# Patient Record
Sex: Female | Born: 1950
Health system: Southern US, Community
[De-identification: ages and names within clinical notes are randomized; demographics above are authoritative.]

## PROBLEM LIST (undated history)

## (undated) DIAGNOSIS — O039 Complete or unspecified spontaneous abortion without complication: Secondary | ICD-10-CM

## (undated) DIAGNOSIS — R9431 Abnormal electrocardiogram [ECG] [EKG]: Secondary | ICD-10-CM

## (undated) DIAGNOSIS — I10 Essential (primary) hypertension: Secondary | ICD-10-CM

## (undated) DIAGNOSIS — E041 Nontoxic single thyroid nodule: Secondary | ICD-10-CM

## (undated) DIAGNOSIS — E118 Type 2 diabetes mellitus with unspecified complications: Secondary | ICD-10-CM

## (undated) DIAGNOSIS — T4145XA Adverse effect of unspecified anesthetic, initial encounter: Secondary | ICD-10-CM

## (undated) DIAGNOSIS — K449 Diaphragmatic hernia without obstruction or gangrene: Secondary | ICD-10-CM

## (undated) DIAGNOSIS — T7840XA Allergy, unspecified, initial encounter: Secondary | ICD-10-CM

## (undated) DIAGNOSIS — K219 Gastro-esophageal reflux disease without esophagitis: Secondary | ICD-10-CM

## (undated) DIAGNOSIS — M1990 Unspecified osteoarthritis, unspecified site: Secondary | ICD-10-CM

## (undated) DIAGNOSIS — K5790 Diverticulosis of intestine, part unspecified, without perforation or abscess without bleeding: Secondary | ICD-10-CM

## (undated) DIAGNOSIS — E785 Hyperlipidemia, unspecified: Secondary | ICD-10-CM

## (undated) DIAGNOSIS — H269 Unspecified cataract: Secondary | ICD-10-CM

## (undated) DIAGNOSIS — T300 Burn of unspecified body region, unspecified degree: Secondary | ICD-10-CM

## (undated) DIAGNOSIS — J309 Allergic rhinitis, unspecified: Secondary | ICD-10-CM

## (undated) DIAGNOSIS — E119 Type 2 diabetes mellitus without complications: Secondary | ICD-10-CM

## (undated) DIAGNOSIS — E282 Polycystic ovarian syndrome: Secondary | ICD-10-CM

## (undated) DIAGNOSIS — M6530 Trigger finger, unspecified finger: Secondary | ICD-10-CM

## (undated) DIAGNOSIS — K5792 Diverticulitis of intestine, part unspecified, without perforation or abscess without bleeding: Secondary | ICD-10-CM

## (undated) DIAGNOSIS — N83209 Unspecified ovarian cyst, unspecified side: Secondary | ICD-10-CM

## (undated) DIAGNOSIS — S62102A Fracture of unspecified carpal bone, left wrist, initial encounter for closed fracture: Secondary | ICD-10-CM

## (undated) DIAGNOSIS — Z8719 Personal history of other diseases of the digestive system: Secondary | ICD-10-CM

## (undated) DIAGNOSIS — T8859XA Other complications of anesthesia, initial encounter: Secondary | ICD-10-CM

## (undated) DIAGNOSIS — S63056A Dislocation of other carpometacarpal joint of unspecified hand, initial encounter: Secondary | ICD-10-CM

## (undated) DIAGNOSIS — J301 Allergic rhinitis due to pollen: Secondary | ICD-10-CM

## (undated) HISTORY — DX: Burn of unspecified body region, unspecified degree: T30.0

## (undated) HISTORY — DX: Type 2 diabetes mellitus without complications: E11.9

## (undated) HISTORY — DX: Allergy, unspecified, initial encounter: T78.40XA

## (undated) HISTORY — DX: Nontoxic single thyroid nodule: E04.1

## (undated) HISTORY — DX: Polycystic ovarian syndrome: E28.2

## (undated) HISTORY — DX: Gastro-esophageal reflux disease without esophagitis: K21.9

## (undated) HISTORY — DX: Allergic rhinitis, unspecified: J30.9

## (undated) HISTORY — DX: Diverticulosis of intestine, part unspecified, without perforation or abscess without bleeding: K57.90

## (undated) HISTORY — DX: Dislocation of other carpometacarpal joint of unspecified hand, initial encounter: S63.056A

## (undated) HISTORY — PX: TONSILECTOMY, ADENOIDECTOMY, BILATERAL MYRINGOTOMY AND TUBES: SHX2538

## (undated) HISTORY — DX: Unspecified osteoarthritis, unspecified site: M19.90

## (undated) HISTORY — DX: Unspecified cataract: H26.9

## (undated) HISTORY — DX: Unspecified ovarian cyst, unspecified side: N83.209

## (undated) HISTORY — DX: Essential (primary) hypertension: I10

## (undated) HISTORY — DX: Complete or unspecified spontaneous abortion without complication: O03.9

## (undated) HISTORY — DX: Gastro-esophageal reflux disease without esophagitis: K44.9

## (undated) HISTORY — DX: Hyperlipidemia, unspecified: E78.5

## (undated) HISTORY — PX: BREAST EXCISIONAL BIOPSY: SUR124

## (undated) HISTORY — DX: Diverticulitis of intestine, part unspecified, without perforation or abscess without bleeding: K57.92

## (undated) HISTORY — DX: Fracture of unspecified carpal bone, left wrist, initial encounter for closed fracture: S62.102A

## (undated) HISTORY — DX: Abnormal electrocardiogram (ECG) (EKG): R94.31

## (undated) HISTORY — DX: Trigger finger, unspecified finger: M65.30

---
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
## 1972-06-03 DIAGNOSIS — T300 Burn of unspecified body region, unspecified degree: Secondary | ICD-10-CM

## 1972-06-03 DIAGNOSIS — J309 Allergic rhinitis, unspecified: Secondary | ICD-10-CM

## 1972-06-03 HISTORY — DX: Allergic rhinitis, unspecified: J30.9

## 1972-06-03 HISTORY — DX: Burn of unspecified body region, unspecified degree: T30.0

## 1991-06-04 DIAGNOSIS — R9431 Abnormal electrocardiogram [ECG] [EKG]: Secondary | ICD-10-CM

## 1991-06-04 HISTORY — DX: Abnormal electrocardiogram (ECG) (EKG): R94.31

## 2003-06-04 HISTORY — PX: ESOPHAGOGASTRODUODENOSCOPY: SHX1529

## 2003-06-04 HISTORY — PX: EXCISION / BIOPSY BREAST / NIPPLE / DUCT: SUR469

## 2004-06-03 HISTORY — PX: LARYNGOSCOPY: SUR817

## 2005-06-03 DIAGNOSIS — N83209 Unspecified ovarian cyst, unspecified side: Secondary | ICD-10-CM

## 2005-06-03 HISTORY — DX: Unspecified ovarian cyst, unspecified side: N83.209

## 2005-06-03 HISTORY — PX: OTHER SURGICAL HISTORY: SHX169

## 2005-06-03 HISTORY — PX: BREAST SURGERY: SHX581

## 2008-03-07 LAB — HM DEXA SCAN: HM DEXA SCAN: NORMAL

## 2009-06-03 DIAGNOSIS — E041 Nontoxic single thyroid nodule: Secondary | ICD-10-CM

## 2009-06-03 HISTORY — DX: Nontoxic single thyroid nodule: E04.1

## 2010-06-03 DIAGNOSIS — S63056A Dislocation of other carpometacarpal joint of unspecified hand, initial encounter: Secondary | ICD-10-CM

## 2010-06-03 DIAGNOSIS — M6530 Trigger finger, unspecified finger: Secondary | ICD-10-CM

## 2010-06-03 HISTORY — DX: Trigger finger, unspecified finger: M65.30

## 2010-06-03 HISTORY — DX: Dislocation of other carpometacarpal joint of unspecified hand, initial encounter: S63.056A

## 2013-08-24 HISTORY — PX: COLONOSCOPY: SHX5424

## 2016-08-31 ENCOUNTER — Encounter: Payer: Self-pay | Admitting: Internal Medicine

## 2016-09-06 ENCOUNTER — Encounter: Payer: Self-pay | Admitting: Internal Medicine

## 2016-09-06 ENCOUNTER — Ambulatory Visit
Admission: RE | Admit: 2016-09-06 | Discharge: 2016-09-06 | Disposition: A | Discharge status: Home / Self Care | Payer: Medicare Other | Source: Ambulatory Visit | Attending: Internal Medicine | Admitting: Internal Medicine

## 2016-09-06 ENCOUNTER — Ambulatory Visit (INDEPENDENT_AMBULATORY_CARE_PROVIDER_SITE_OTHER): Payer: Medicare Other | Admitting: Internal Medicine

## 2016-09-06 ENCOUNTER — Other Ambulatory Visit: Payer: Self-pay | Admitting: Internal Medicine

## 2016-09-06 VITALS — BP 124/76 | HR 81 | Temp 98.4°F | Ht 63.39 in | Wt 179.0 lb

## 2016-09-06 DIAGNOSIS — E119 Type 2 diabetes mellitus without complications: Secondary | ICD-10-CM

## 2016-09-06 DIAGNOSIS — D242 Benign neoplasm of left breast: Secondary | ICD-10-CM | POA: Diagnosis not present

## 2016-09-06 DIAGNOSIS — E785 Hyperlipidemia, unspecified: Secondary | ICD-10-CM

## 2016-09-06 DIAGNOSIS — E041 Nontoxic single thyroid nodule: Secondary | ICD-10-CM | POA: Diagnosis not present

## 2016-09-06 DIAGNOSIS — K449 Diaphragmatic hernia without obstruction or gangrene: Secondary | ICD-10-CM | POA: Diagnosis not present

## 2016-09-06 DIAGNOSIS — J40 Bronchitis, not specified as acute or chronic: Secondary | ICD-10-CM | POA: Insufficient documentation

## 2016-09-06 DIAGNOSIS — K219 Gastro-esophageal reflux disease without esophagitis: Secondary | ICD-10-CM | POA: Insufficient documentation

## 2016-09-06 DIAGNOSIS — E118 Type 2 diabetes mellitus with unspecified complications: Secondary | ICD-10-CM | POA: Insufficient documentation

## 2016-09-06 DIAGNOSIS — N6011 Diffuse cystic mastopathy of right breast: Secondary | ICD-10-CM | POA: Insufficient documentation

## 2016-09-06 DIAGNOSIS — E782 Mixed hyperlipidemia: Secondary | ICD-10-CM | POA: Diagnosis not present

## 2016-09-06 DIAGNOSIS — D126 Benign neoplasm of colon, unspecified: Secondary | ICD-10-CM | POA: Diagnosis not present

## 2016-09-06 DIAGNOSIS — R05 Cough: Secondary | ICD-10-CM | POA: Diagnosis not present

## 2016-09-06 DIAGNOSIS — I10 Essential (primary) hypertension: Secondary | ICD-10-CM | POA: Diagnosis not present

## 2016-09-06 DIAGNOSIS — N6012 Diffuse cystic mastopathy of left breast: Secondary | ICD-10-CM

## 2016-09-06 DIAGNOSIS — E1169 Type 2 diabetes mellitus with other specified complication: Secondary | ICD-10-CM | POA: Insufficient documentation

## 2016-09-06 MED ORDER — ATORVASTATIN CALCIUM 20 MG PO TABS: 20.0000 mg | ORAL_TABLET | Freq: Every day | ORAL | 1 refills | Status: DC

## 2016-09-06 MED ORDER — LEVOFLOXACIN 500 MG PO TABS: 500.0000 mg | ORAL_TABLET | Freq: Every day | ORAL | 0 refills | Status: DC

## 2016-09-06 MED ORDER — LISINOPRIL 10 MG PO TABS: 10.0000 mg | ORAL_TABLET | Freq: Every day | ORAL | 1 refills | Status: DC

## 2016-09-06 MED ORDER — METFORMIN HCL 500 MG PO TABS: 250.0000 mg | ORAL_TABLET | Freq: Every day | ORAL | 1 refills | Status: DC

## 2016-09-06 NOTE — Progress Notes (Signed)
Date:  09/06/2016   Name:  Pamela Rosario   DOB:  [birth year]   MRN:  [id number]   Chief Complaint: Establish Care (Son had baby and moved here from [facility] to help care for him.) and Cough (Off on and cough since November. Thinks it post nasal drip- Green production in morning but that is a new symptom. Tickle in throat and sometimes wheezes. ) Cough  This is a new problem. The current episode started more than 1 month ago. The problem has been unchanged. The problem occurs every few hours. The cough is productive of sputum. Associated symptoms include nasal congestion, postnasal drip, a sore throat and wheezing. Pertinent negatives include no chest pain, chills, ear pain, fever, headaches or shortness of breath. She has tried nothing for the symptoms.  Diabetes  She presents for her follow-up (onset 2007) diabetic visit. She has type 2 diabetes mellitus. Pertinent negatives for hypoglycemia include no dizziness, headaches or pallor. Pertinent negatives for diabetes include no chest pain. Current diabetic treatment includes oral agent (monotherapy).  Hyperlipidemia  This is a chronic problem. The problem is controlled. Pertinent negatives include no chest pain or shortness of breath. Current antihyperlipidemic treatment includes statins.  Hypertension  This is a new problem. The current episode started more than 1 year ago. The problem is unchanged. The problem is controlled. Pertinent negatives include no chest pain, headaches, palpitations or shortness of breath.  Gastroesophageal Reflux  She complains of coughing, a sore throat and wheezing. She reports no abdominal pain or no chest pain.    Review of Systems  Constitutional: Negative for chills, diaphoresis and fever.  HENT: Positive for postnasal drip and sore throat. Negative for ear pain, trouble swallowing and voice change.   Eyes: Negative for visual disturbance.  Respiratory: Positive for cough and wheezing. Negative for chest tightness  and shortness of breath.   Cardiovascular: Negative for chest pain, palpitations and leg swelling.  Gastrointestinal: Negative for abdominal pain.  Musculoskeletal: Negative for arthralgias and gait problem.  Skin: Negative for pallor and wound.  Neurological: Negative for dizziness, numbness and headaches.  Psychiatric/Behavioral: Negative for sleep disturbance.    There are no active problems to display for this patient.   Prior to Admission medications   Medication Sig Start Date End Date Taking? Authorizing Provider  aspirin 81 MG chewable tablet Chew 81 mg by mouth daily.   Yes Historical Provider, MD  atorvastatin (LIPITOR) 40 MG tablet Take 40 mg by mouth daily. 06/13/16  Yes Historical Provider, MD  glucose blood test strip 1 each by Other route as needed for other. Use as instructed   Yes Historical Provider, MD  lisinopril (PRINIVIL,ZESTRIL) 20 MG tablet Take 10 mg by mouth daily. 06/13/16  Yes Historical Provider, MD  metFORMIN (GLUCOPHAGE) 500 MG tablet Take 250 mg by mouth daily. 06/13/16  Yes Historical Provider, MD  omeprazole (PRILOSEC) 20 MG capsule Take 20 mg by mouth daily.   Yes Historical Provider, MD    Allergies  Allergen Reactions  . Demerol [Meperidine] Hives  . Tetracyclines & Related Hives  . Pravastatin Other (See Comments)    Muscle pain  . Simvastatin Other (See Comments)    Muscle pain    Past Surgical History:  Procedure Laterality Date  . TONSILECTOMY, ADENOIDECTOMY, BILATERAL MYRINGOTOMY AND TUBES      Social History  Substance Use Topics  . Smoking status: Never Smoker  . Smokeless tobacco: Never Used  . Alcohol use No  Medication list has been reviewed and updated.   Physical Exam  Constitutional: She is oriented to person, place, and time. She appears well-developed. No distress.  HENT:  Head: Normocephalic and atraumatic.  Neck: Normal range of motion. Neck supple. No thyromegaly present.  Cardiovascular: Normal rate, regular  rhythm, normal heart sounds and normal pulses.   Pulmonary/Chest: Effort normal. No respiratory distress. She has no decreased breath sounds. She has no wheezes. She has rhonchi in the left lower field.  Abdominal: Soft.  Musculoskeletal: Normal range of motion.  Neurological: She is alert and oriented to person, place, and time.  Skin: Skin is warm and dry. No rash noted.  Psychiatric: She has a normal mood and affect. Her speech is normal and behavior is normal. Thought content normal.  Nursing note and vitals reviewed.   BP 124/76 (BP Location: Left Arm, Patient Position: Sitting, Cuff Size: Normal)   Pulse 81   Temp 98.4 F (36.9 C)   Ht 5' 3.39" (1.61 m)   Wt 179 lb (81.2 kg)   SpO2 96%   BMI 31.32 kg/m   Assessment and Plan: 1. Bronchitis - levofloxacin (LEVAQUIN) 500 MG tablet; Take 1 tablet (500 mg total) by mouth daily.  Dispense: 10 tablet; Refill: 0 - DG Chest 2 View; Future  2. Type 2 diabetes mellitus without complication, without long-term current use of insulin (HCC) Continue oral agents  3. Essential hypertension controlled  4. Mixed hyperlipidemia On statin therapy  5. Intraductal papilloma of breast, left s/p excisional bx > 10 yrs Due for 6 mo Dx mammo and [place] on left for new density seen 04/2016  6. Fibrocystic breast changes of both breasts As above  7. Hiatal hernia with GERD without esophagitis Controlled on PPI  8. Thyroid nodule Stable nodules noted in 2017   9. Adenomatous polyp of colon, unspecified part of colon Due for colonoscopy 2020   Meds ordered this encounter  Medications  . atorvastatin (LIPITOR) 20 MG tablet    Sig: Take 1 tablet (20 mg total) by mouth daily.    Dispense:  90 tablet    Refill:  1  . lisinopril (PRINIVIL,ZESTRIL) 10 MG tablet    Sig: Take 1 tablet (10 mg total) by mouth daily.    Dispense:  90 tablet    Refill:  1  . metFORMIN (GLUCOPHAGE) 500 MG tablet    Sig: Take 0.5 tablets (250 mg total) by mouth  daily.    Dispense:  45 tablet    Refill:  1  . levofloxacin (LEVAQUIN) 500 MG tablet    Sig: Take 1 tablet (500 mg total) by mouth daily.    Dispense:  10 tablet    Refill:  0    Halina Maidens, MD [facility]  09/06/2016

## 2016-09-17 ENCOUNTER — Inpatient Hospital Stay
Admission: RE | Admit: 2016-09-17 | Discharge: 2016-09-17 | Disposition: A | Discharge status: Home / Self Care | Payer: Self-pay | Source: Ambulatory Visit | Attending: *Deleted | Admitting: *Deleted

## 2016-09-17 ENCOUNTER — Other Ambulatory Visit: Payer: Self-pay | Admitting: *Deleted

## 2016-09-17 DIAGNOSIS — Z9289 Personal history of other medical treatment: Secondary | ICD-10-CM

## 2016-09-20 ENCOUNTER — Other Ambulatory Visit: Payer: Self-pay | Admitting: Internal Medicine

## 2016-09-20 ENCOUNTER — Telehealth: Payer: Self-pay

## 2016-09-20 DIAGNOSIS — R928 Other abnormal and inconclusive findings on diagnostic imaging of breast: Secondary | ICD-10-CM

## 2016-09-20 DIAGNOSIS — Z1231 Encounter for screening mammogram for malignant neoplasm of breast: Secondary | ICD-10-CM

## 2016-09-20 NOTE — Telephone Encounter (Signed)
Pt informed order sent.

## 2016-09-20 NOTE — Telephone Encounter (Signed)
Pt called requesting for order to be sent in for mammo.

## 2016-09-20 NOTE — Telephone Encounter (Signed)
Orders placed.

## 2016-09-23 ENCOUNTER — Other Ambulatory Visit: Payer: Self-pay | Admitting: Internal Medicine

## 2016-09-23 DIAGNOSIS — R928 Other abnormal and inconclusive findings on diagnostic imaging of breast: Secondary | ICD-10-CM

## 2016-10-11 ENCOUNTER — Ambulatory Visit
Admission: RE | Admit: 2016-10-11 | Discharge: 2016-10-11 | Disposition: A | Discharge status: Home / Self Care | Payer: Medicare Other | Source: Ambulatory Visit | Attending: Internal Medicine | Admitting: Internal Medicine

## 2016-10-11 ENCOUNTER — Encounter: Payer: Self-pay | Admitting: Radiology

## 2016-10-11 DIAGNOSIS — R928 Other abnormal and inconclusive findings on diagnostic imaging of breast: Secondary | ICD-10-CM | POA: Diagnosis not present

## 2016-10-11 DIAGNOSIS — N6002 Solitary cyst of left breast: Secondary | ICD-10-CM | POA: Diagnosis not present

## 2016-10-18 DIAGNOSIS — J0100 Acute maxillary sinusitis, unspecified: Secondary | ICD-10-CM | POA: Diagnosis not present

## 2016-10-18 DIAGNOSIS — R05 Cough: Secondary | ICD-10-CM | POA: Diagnosis not present

## 2016-11-13 ENCOUNTER — Ambulatory Visit (INDEPENDENT_AMBULATORY_CARE_PROVIDER_SITE_OTHER): Payer: Medicare Other | Admitting: Internal Medicine

## 2016-11-13 ENCOUNTER — Encounter: Payer: Self-pay | Admitting: Internal Medicine

## 2016-11-13 VITALS — BP 120/64 | HR 81 | Temp 98.2°F | Resp 17 | Ht 63.39 in | Wt 179.0 lb

## 2016-11-13 DIAGNOSIS — E041 Nontoxic single thyroid nodule: Secondary | ICD-10-CM | POA: Diagnosis not present

## 2016-11-13 DIAGNOSIS — N6012 Diffuse cystic mastopathy of left breast: Secondary | ICD-10-CM

## 2016-11-13 DIAGNOSIS — N6011 Diffuse cystic mastopathy of right breast: Secondary | ICD-10-CM

## 2016-11-13 DIAGNOSIS — E119 Type 2 diabetes mellitus without complications: Secondary | ICD-10-CM

## 2016-11-13 DIAGNOSIS — I10 Essential (primary) hypertension: Secondary | ICD-10-CM

## 2016-11-13 DIAGNOSIS — E782 Mixed hyperlipidemia: Secondary | ICD-10-CM | POA: Diagnosis not present

## 2016-11-13 NOTE — Patient Instructions (Addendum)
Schedule Diabetic eye Exam

## 2016-11-13 NOTE — Progress Notes (Signed)
Date:  11/13/2016   Name:  Pamela Rosario   DOB:  1951/06/03   MRN:  [id number]   Chief Complaint: Diabetes and Hypertension Diabetes  She presents for her follow-up diabetic visit. She has type 2 diabetes mellitus. Pertinent negatives for hypoglycemia include no headaches or tremors. Pertinent negatives for diabetes include no chest pain, no fatigue, no polydipsia and no polyuria. Current diabetic treatment includes oral agent (monotherapy). She is compliant with treatment most of the time. She monitors blood glucose at home 3-4 x per week. Her breakfast blood glucose is taken between 7-8 am. Her breakfast blood glucose range is generally 110-130 mg/dl. An ACE inhibitor/angiotensin II receptor blocker is being taken.  Hypertension  This is a chronic problem. The problem is controlled. Pertinent negatives include no chest pain, headaches, palpitations or shortness of breath. Past treatments include ACE inhibitors. The current treatment provides significant improvement.  Abnormal mammogram - had abnormal mammogram in 04/2016.  Recent 6 mo follow up showed stable findings and recommend annual screening.  Recurrent bronchitis - resolved after augmentin given at [facility].    Review of Systems  Constitutional: Negative for appetite change, fatigue, fever and unexpected weight change.  HENT: Negative for tinnitus and trouble swallowing.   Eyes: Negative for visual disturbance.  Respiratory: Negative for cough, chest tightness and shortness of breath.   Cardiovascular: Negative for chest pain, palpitations and leg swelling.  Gastrointestinal: Negative for abdominal pain.  Endocrine: Negative for polydipsia and polyuria.  Genitourinary: Negative for dysuria and hematuria.  Musculoskeletal: Negative for arthralgias.  Neurological: Negative for tremors, numbness and headaches.  Psychiatric/Behavioral: Negative for dysphoric mood.    Patient Active Problem List   Diagnosis Date Noted  . Hiatal  hernia with GERD without esophagitis 09/06/2016  . Fibrocystic breast changes of both breasts 09/06/2016  . Intraductal papilloma of breast, left 09/06/2016  . Mixed hyperlipidemia 09/06/2016  . Essential hypertension 09/06/2016  . Type 2 diabetes mellitus without complication, without long-term current use of insulin ([place]) 09/06/2016  . Thyroid nodule 09/06/2016  . Adenomatous colon polyp 09/06/2016    Prior to Admission medications   Medication Sig Start Date End Date Taking? Authorizing Provider  aspirin 81 MG chewable tablet Chew 81 mg by mouth daily.   Yes [provider]  atorvastatin (LIPITOR) 20 MG tablet Take 1 tablet (20 mg total) by mouth daily. 09/06/16  Yes Glean Hess, MD  glucose blood test strip 1 each by Other route as needed for other. Use as instructed   Yes [provider]  lisinopril (PRINIVIL,ZESTRIL) 10 MG tablet Take 1 tablet (10 mg total) by mouth daily. 09/06/16  Yes Glean Hess, MD  metFORMIN (GLUCOPHAGE) 500 MG tablet Take 0.5 tablets (250 mg total) by mouth daily. 09/06/16  Yes Glean Hess, MD  omeprazole (PRILOSEC) 20 MG capsule Take 20 mg by mouth daily.   Yes [provider]    Allergies  Allergen Reactions  . Demerol [Meperidine] Hives  . Tetracyclines & Related Hives  . Simvastatin Other (See Comments)    Muscle pain    Past Surgical History:  Procedure Laterality Date  . brachial cleft cyst excison ( Right Neck )  1971  . [place]   emergency  . COLONOSCOPY  08/24/2013   tubular adenoma  . ESOPHAGOGASTRODUODENOSCOPY  2005   hiatal hernia & polyp found  . EXCISION / BIOPSY BREAST / NIPPLE / DUCT Left 2005  . LARYNGOSCOPY  2006  .  left intraductal papilloma excision of left breast  2007  . TONSILECTOMY, ADENOIDECTOMY, BILATERAL MYRINGOTOMY AND TUBES      Social History  Substance Use Topics  . Smoking status: Never Smoker  . Smokeless tobacco: Never Used  . Alcohol use No    Depression screen PHQ 2/9 11/13/2016  Decreased Interest 0  Down, Depressed, Hopeless 0  PHQ - 2 Score 0   Fall Risk  11/13/2016  Falls in the past year? No    Medication list has been reviewed and updated.   Physical Exam  Constitutional: She is oriented to person, place, and time. She appears well-developed. No distress.  HENT:  Head: Normocephalic and atraumatic.  Neck: Normal range of motion. Neck supple. Carotid bruit is not present. No thyromegaly present.  Cardiovascular: Normal rate, regular rhythm and normal heart sounds.   Pulmonary/Chest: Effort normal and breath sounds normal. No respiratory distress. She has no wheezes. She has no rales.  Musculoskeletal: Normal range of motion.  Neurological: She is alert and oriented to person, place, and time.  Skin: Skin is warm and dry. No rash noted.  Psychiatric: She has a normal mood and affect. Her behavior is normal. Thought content normal.  Nursing note and vitals reviewed.   BP 120/64 (BP Location: Right Arm, Patient Position: Sitting, Cuff Size: Normal)   Pulse 81   Temp 98.2 F (36.8 C) (Oral)   Resp 17   Ht 5' 3.39" (1.61 m)   Wt 179 lb (81.2 kg)   SpO2 97%   BMI 31.32 kg/m   Assessment and Plan: 1. Type 2 diabetes mellitus without complication, without long-term current use of insulin (HCC) Continue diet and oral medication - Hemoglobin H4T - Basic metabolic panel  2. Essential hypertension Controlled   3. Mixed hyperlipidemia On statin therapy  4. Thyroid nodule asx - repeat US in November  5. Fibrocystic breast changes of both breasts Recent mammogram stable Resume annual exams   No orders of the defined types were placed in this encounter.   Halina Maidens, MD [facility]  11/13/2016

## 2016-11-14 LAB — BASIC METABOLIC PANEL
BUN/Creatinine Ratio: 20 (ref 12–28)
BUN: 18 mg/dL (ref 8–27)
CO2: 24 mmol/L (ref 20–29)
Calcium: 9.4 mg/dL (ref 8.7–10.3)
Chloride: 100 mmol/L (ref 96–106)
Creatinine, Ser: 0.91 mg/dL (ref 0.57–1.00)
GFR calc Af Amer: 77 mL/min/{1.73_m2} (ref >59)
GFR calc non Af Amer: 66 mL/min/{1.73_m2} (ref >59)
GLUCOSE: 103 mg/dL — AB (ref 65–99)
POTASSIUM: 4.4 mmol/L (ref 3.5–5.2)
SODIUM: 141 mmol/L (ref 134–144)

## 2016-11-14 LAB — HEMOGLOBIN A1C
Est. average glucose Bld gHb Est-mCnc: 146 mg/dL
Hgb A1c MFr Bld: 6.7 % — ABNORMAL HIGH (ref 4.8–5.6)

## 2016-11-26 DIAGNOSIS — R05 Cough: Secondary | ICD-10-CM | POA: Diagnosis not present

## 2016-11-26 DIAGNOSIS — J301 Allergic rhinitis due to pollen: Secondary | ICD-10-CM | POA: Diagnosis not present

## 2016-11-26 DIAGNOSIS — J4520 Mild intermittent asthma, uncomplicated: Secondary | ICD-10-CM | POA: Diagnosis not present

## 2016-12-17 DIAGNOSIS — J301 Allergic rhinitis due to pollen: Secondary | ICD-10-CM | POA: Diagnosis not present

## 2016-12-17 DIAGNOSIS — Z91013 Allergy to seafood: Secondary | ICD-10-CM | POA: Diagnosis not present

## 2017-01-16 DIAGNOSIS — J301 Allergic rhinitis due to pollen: Secondary | ICD-10-CM | POA: Diagnosis not present

## 2017-01-20 DIAGNOSIS — Z516 Encounter for desensitization to allergens: Secondary | ICD-10-CM | POA: Insufficient documentation

## 2017-01-20 DIAGNOSIS — J301 Allergic rhinitis due to pollen: Secondary | ICD-10-CM | POA: Diagnosis not present

## 2017-01-24 DIAGNOSIS — J301 Allergic rhinitis due to pollen: Secondary | ICD-10-CM | POA: Diagnosis not present

## 2017-01-27 ENCOUNTER — Other Ambulatory Visit: Payer: Self-pay | Admitting: Internal Medicine

## 2017-01-27 ENCOUNTER — Ambulatory Visit (INDEPENDENT_AMBULATORY_CARE_PROVIDER_SITE_OTHER): Payer: Medicare Other

## 2017-01-27 VITALS — BP 132/70 | HR 78 | Temp 98.4°F | Resp 17 | Ht 63.0 in | Wt 178.6 lb

## 2017-01-27 DIAGNOSIS — Z0000 Encounter for general adult medical examination without abnormal findings: Secondary | ICD-10-CM

## 2017-01-27 MED ORDER — OMEPRAZOLE 20 MG PO TBEC: 20.0000 mg | DELAYED_RELEASE_TABLET | Freq: Two times a day (BID) | ORAL | 1 refills | Status: DC

## 2017-01-27 MED ORDER — LISINOPRIL 10 MG PO TABS: 10.0000 mg | ORAL_TABLET | Freq: Every day | ORAL | 1 refills | Status: DC

## 2017-01-27 NOTE — Patient Instructions (Addendum)
Pamela Rosario , Thank you for taking time to come for your Medicare Wellness Visit. I appreciate your ongoing commitment to your health goals. Please review the following plan we discussed and let me know if I can assist you in the future.   Screening recommendations/referrals: Colonoscopy: up to date Mammogram:up to date Bone Density: up to date Recommended yearly ophthalmology/optometry visit for glaucoma screening and checkup Recommended yearly dental visit for hygiene and checkup  Vaccinations: Influenza vaccine: due now Pneumococcal vaccine: up to date Tdap vaccine: up to date Shingles vaccine: due, check with your insurance company for coverage  Please bring a copy of any previous screenings and vaccinations.  Advanced directives: Please bring a copy of your health care power of attorney and living will to the office at your convenience.  Conditions/risks identified: none   Next appointment: Follow up on 02/25/2017 at 9:30am with Dr.Berglund. Follow up in one year for your annual wellness exam.    Preventive Care 65 Years and Older, Female Preventive care refers to lifestyle choices and visits with your health care provider that can promote health and wellness. What does preventive care include?  A yearly physical exam. This is also called an annual well check.  Dental exams once or twice a year.  Routine eye exams. Ask your health care provider how often you should have your eyes checked.  Personal lifestyle choices, including:  Daily care of your teeth and gums.  Regular physical activity.  Eating a healthy diet.  Avoiding tobacco and drug use.  Limiting alcohol use.  Practicing safe sex.  Taking low-dose aspirin every day.  Taking vitamin and mineral supplements as recommended by your health care provider. What happens during an annual well check? The services and screenings done by your health care provider during your annual well check will depend on your  age, overall health, lifestyle risk factors, and family history of disease. Counseling  Your health care provider may ask you questions about your:  Alcohol use.  Tobacco use.  Drug use.  Emotional well-being.  Home and relationship well-being.  Sexual activity.  Eating habits.  History of falls.  Memory and ability to understand (cognition).  Work and work Statistician.  Reproductive health. Screening  You may have the following tests or measurements:  Height, weight, and BMI.  Blood pressure.  Lipid and cholesterol levels. These may be checked every 5 years, or more frequently if you are over 58 years old.  Skin check.  Lung cancer screening. You may have this screening every year starting at age 41 if you have a 30-pack-year history of smoking and currently smoke or have quit within the past 15 years.  Fecal occult blood test (FOBT) of the stool. You may have this test every year starting at age 90.  Flexible sigmoidoscopy or colonoscopy. You may have a sigmoidoscopy every 5 years or a colonoscopy every 10 years starting at age 71.  Hepatitis C blood test.  Hepatitis B blood test.  Sexually transmitted disease (STD) testing.  Diabetes screening. This is done by checking your blood sugar (glucose) after you have not eaten for a while (fasting). You may have this done every 1-3 years.  Bone density scan. This is done to screen for osteoporosis. You may have this done starting at age 60.  Mammogram. This may be done every 1-2 years. Talk to your health care provider about how often you should have regular mammograms. Talk with your health care provider about your test results, treatment  options, and if necessary, the need for more tests. Vaccines  Your health care provider may recommend certain vaccines, such as:  Influenza vaccine. This is recommended every year.  Tetanus, diphtheria, and acellular pertussis (Tdap, Td) vaccine. You may need a Td booster  every 10 years.  Zoster vaccine. You may need this after age 90.  Pneumococcal 13-valent conjugate (PCV13) vaccine. One dose is recommended after age 12.  Pneumococcal polysaccharide (PPSV23) vaccine. One dose is recommended after age 50. Talk to your health care provider about which screenings and vaccines you need and how often you need them. This information is not intended to replace advice given to you by your health care provider. Make sure you discuss any questions you have with your health care provider. Document Released: 06/16/2015 Document Revised: 02/07/2016 Document Reviewed: 03/21/2015 Elsevier Interactive Patient Education  2017 [place] Prevention in the Home Falls can cause injuries. They can happen to people of all ages. There are many things you can do to make your home safe and to help prevent falls. What can I do on the outside of my home?  Regularly fix the edges of walkways and driveways and fix any cracks.  Remove anything that might make you trip as you walk through a door, such as a raised step or threshold.  Trim any bushes or trees on the path to your home.  Use bright outdoor lighting.  Clear any walking paths of anything that might make someone trip, such as rocks or tools.  Regularly check to see if handrails are loose or broken. Make sure that both sides of any steps have handrails.  Any raised decks and porches should have guardrails on the edges.  Have any leaves, snow, or ice cleared regularly.  Use sand or salt on walking paths during winter.  Clean up any spills in your garage right away. This includes oil or grease spills. What can I do in the bathroom?  Use night lights.  Install grab bars by the toilet and in the tub and shower. Do not use towel bars as grab bars.  Use non-skid mats or decals in the tub or shower.  If you need to sit down in the shower, use a plastic, non-slip stool.  Keep the floor dry. Clean up any  water that spills on the floor as soon as it happens.  Remove soap buildup in the tub or shower regularly.  Attach bath mats securely with double-sided non-slip rug tape.  Do not have throw rugs and other things on the floor that can make you trip. What can I do in the bedroom?  Use night lights.  Make sure that you have a light by your bed that is easy to reach.  Do not use any sheets or blankets that are too big for your bed. They should not hang down onto the floor.  Have a firm chair that has side arms. You can use this for support while you get dressed.  Do not have throw rugs and other things on the floor that can make you trip. What can I do in the kitchen?  Clean up any spills right away.  Avoid walking on wet floors.  Keep items that you use a lot in easy-to-reach places.  If you need to reach something above you, use a strong step stool that has a grab bar.  Keep electrical cords out of the way.  Do not use floor polish or wax that makes floors slippery.  If you must use wax, use non-skid floor wax.  Do not have throw rugs and other things on the floor that can make you trip. What can I do with my stairs?  Do not leave any items on the stairs.  Make sure that there are handrails on both sides of the stairs and use them. Fix handrails that are broken or loose. Make sure that handrails are as long as the stairways.  Check any carpeting to make sure that it is firmly attached to the stairs. Fix any carpet that is loose or worn.  Avoid having throw rugs at the top or bottom of the stairs. If you do have throw rugs, attach them to the floor with carpet tape.  Make sure that you have a light switch at the top of the stairs and the bottom of the stairs. If you do not have them, ask someone to add them for you. What else can I do to help prevent falls?  Wear shoes that:  Do not have high heels.  Have rubber bottoms.  Are comfortable and fit you well.  Are closed  at the toe. Do not wear sandals.  If you use a stepladder:  Make sure that it is fully opened. Do not climb a closed stepladder.  Make sure that both sides of the stepladder are locked into place.  Ask someone to hold it for you, if possible.  Clearly mark and make sure that you can see:  Any grab bars or handrails.  First and last steps.  Where the edge of each step is.  Use tools that help you move around (mobility aids) if they are needed. These include:  Canes.  Walkers.  Scooters.  Crutches.  Turn on the lights when you go into a dark area. Replace any light bulbs as soon as they burn out.  Set up your furniture so you have a clear path. Avoid moving your furniture around.  If any of your floors are uneven, fix them.  If there are any pets around you, be aware of where they are.  Review your medicines with your doctor. Some medicines can make you feel dizzy. This can increase your chance of falling. Ask your doctor what other things that you can do to help prevent falls. This information is not intended to replace advice given to you by your health care provider. Make sure you discuss any questions you have with your health care provider. Document Released: 03/16/2009 Document Revised: 10/26/2015 Document Reviewed: 06/24/2014 Elsevier Interactive Patient Education  2017 [employer].   As discussed, here is the information about the newest shingles vaccine:   Varicella-Zoster Virus Vaccine Live injection What is this medicine? VARICELLA VIRUS VACCINE (var uh SEL uh VAHY ruhs vak SEEN) is used to prevent infections of chickenpox. HERPES ZOSTER VIRUS VACCINE (HUR peez ZOS ter vahy ruhs vak SEEN) is used to prevent shingles in adults 65 years old and over. This vaccine is not used to treat shingles or nerve pain from shingles. These medicines may be used for other purposes; ask your health care provider or pharmacist if you have questions. This medicine may be  used for other purposes; ask your health care provider or pharmacist if you have questions. COMMON BRAND NAME(S): Varivax, Zostavax What should I tell my health care provider before I take this medicine? They need to know if you have any of the following conditions: -blood disorders or disease -cancer like leukemia or lymphoma -immune system problems or  therapy -infection with fever -recent immune globulin therapy -tuberculosis -an unusual or allergic reaction to vaccines, neomycin, gelatin, other medicines, foods, dyes, or preservatives -pregnant or trying to get pregnant -breast-feeding How should I use this medicine? These vaccines are for injection under the skin. They are given by a health care professional. A copy of Vaccine Information Statements will be given before each varicella virus vaccination. Read this sheet carefully each time. The sheet may change frequently. A Vaccine Information Statement is not given before the herpes zoster virus vaccine. Talk to your pediatrician regarding the use of the varicella virus vaccine in children. While this drug may be prescribed for children as young as 2 months of age for selected conditions, precautions do apply. The herpes zoster virus vaccine is not approved in children. Overdosage: If you think you have taken too much of this medicine contact a poison control center or emergency room at once. NOTE: This medicine is only for you. Do not share this medicine with others. What if I miss a dose? Keep appointments for follow-up (booster) doses of varicella virus vaccine as directed. It is important not to miss your dose. Call your doctor or health care professional if you are unable to keep an appointment. Follow-up (booster) doses are not needed for the herpes zoster virus vaccine. What may interact with this medicine? Do not take these medicines with any of the following  medications: -adalimumab -anakinra -etanercept -infliximab -medicines that suppress your immune system -medicines to treat cancer These medicines may also interact with the following medications: -aspirin and aspirin-like medicines (varicella virus vaccine only) -blood transfusions (varicella virus vaccine only) -immunoglobulins (varicella virus vaccine only) -steroid medicines like prednisone or cortisone This list may not describe all possible interactions. Give your health care provider a list of all the medicines, herbs, non-prescription drugs, or dietary supplements you use. Also tell them if you smoke, drink alcohol, or use illegal drugs. Some items may interact with your medicine. What should I watch for while using this medicine? Visit your doctor for regular check ups. These vaccines, like all vaccines, may not fully protect everyone. After receiving these vaccines it may be possible to pass chickenpox infection to others. For up to 6 weeks, avoid people with immune system problems, pregnant women who have not had chickenpox, newborns of women who have not had chickenpox, and all newborns born at less than 28 weeks of pregnancy. Talk to your doctor for more information. Do not become pregnant for 3 months after taking these vaccines. Women should inform their doctor if they wish to become pregnant or think they might be pregnant. There is a potential for serious side effects to an unborn child. Talk to your health care professional or pharmacist for more information. What side effects may I notice from receiving this medicine? Side effects that you should report to your doctor or health care professional as soon as possible: -allergic reactions like skin rash, itching or hives, swelling of the face, lips, or tongue -breathing problems -extreme changes in behavior -feeling faint or lightheaded, falls -fever over 102 degrees F -pain, tingling, numbness in the hands or feet -redness,  blistering, peeling or loosening of the skin, including inside the mouth -seizures -unusually weak or tired Side effects that usually do not require medical attention (report to your doctor or health care professional if they continue or are bothersome): -aches or pains -chickenpox-like rash -diarrhea -headache -low-grade fever under 102 degrees F -loss of appetite -nausea, vomiting -redness, pain, swelling  at site where injected -sleepy -trouble sleeping This list may not describe all possible side effects. Call your doctor for medical advice about side effects. You may report side effects to FDA at 1-800-FDA-1088. Where should I keep my medicine? These drugs are given in a hospital or clinic and will not be stored at home. NOTE: This sheet is a summary. It may not cover all possible information. If you have questions about this medicine, talk to your doctor, pharmacist, or health care provider.  2018 Elsevier/Gold Standard (2013-01-22 14:24:35)
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
## 2017-01-28 DIAGNOSIS — E119 Type 2 diabetes mellitus without complications: Secondary | ICD-10-CM | POA: Diagnosis not present

## 2017-01-28 LAB — HM DIABETES EYE EXAM

## 2017-01-29 LAB — HM DIABETES EYE EXAM

## 2017-01-30 DIAGNOSIS — J301 Allergic rhinitis due to pollen: Secondary | ICD-10-CM | POA: Diagnosis not present

## 2017-01-30 LAB — HM DIABETES EYE EXAM

## 2017-02-04 DIAGNOSIS — J301 Allergic rhinitis due to pollen: Secondary | ICD-10-CM | POA: Diagnosis not present

## 2017-02-06 DIAGNOSIS — J301 Allergic rhinitis due to pollen: Secondary | ICD-10-CM | POA: Diagnosis not present

## 2017-02-07 DIAGNOSIS — J301 Allergic rhinitis due to pollen: Secondary | ICD-10-CM | POA: Diagnosis not present

## 2017-02-10 DIAGNOSIS — J301 Allergic rhinitis due to pollen: Secondary | ICD-10-CM | POA: Diagnosis not present

## 2017-02-13 DIAGNOSIS — J301 Allergic rhinitis due to pollen: Secondary | ICD-10-CM | POA: Diagnosis not present

## 2017-02-18 DIAGNOSIS — J301 Allergic rhinitis due to pollen: Secondary | ICD-10-CM | POA: Diagnosis not present

## 2017-02-24 DIAGNOSIS — J301 Allergic rhinitis due to pollen: Secondary | ICD-10-CM | POA: Diagnosis not present

## 2017-02-25 ENCOUNTER — Encounter: Payer: Self-pay | Admitting: Internal Medicine

## 2017-02-25 ENCOUNTER — Ambulatory Visit (INDEPENDENT_AMBULATORY_CARE_PROVIDER_SITE_OTHER): Payer: Medicare Other | Admitting: Internal Medicine

## 2017-02-25 VITALS — BP 118/60 | HR 81 | Ht 63.0 in | Wt 177.0 lb

## 2017-02-25 DIAGNOSIS — N393 Stress incontinence (female) (male): Secondary | ICD-10-CM | POA: Insufficient documentation

## 2017-02-25 DIAGNOSIS — Z23 Encounter for immunization: Secondary | ICD-10-CM | POA: Diagnosis not present

## 2017-02-25 DIAGNOSIS — Z124 Encounter for screening for malignant neoplasm of cervix: Secondary | ICD-10-CM

## 2017-02-25 DIAGNOSIS — K219 Gastro-esophageal reflux disease without esophagitis: Secondary | ICD-10-CM

## 2017-02-25 DIAGNOSIS — E041 Nontoxic single thyroid nodule: Secondary | ICD-10-CM

## 2017-02-25 DIAGNOSIS — E119 Type 2 diabetes mellitus without complications: Secondary | ICD-10-CM

## 2017-02-25 DIAGNOSIS — E782 Mixed hyperlipidemia: Secondary | ICD-10-CM

## 2017-02-25 DIAGNOSIS — I10 Essential (primary) hypertension: Secondary | ICD-10-CM

## 2017-02-25 DIAGNOSIS — Z1159 Encounter for screening for other viral diseases: Secondary | ICD-10-CM

## 2017-02-25 DIAGNOSIS — K449 Diaphragmatic hernia without obstruction or gangrene: Secondary | ICD-10-CM | POA: Diagnosis not present

## 2017-02-25 LAB — POCT URINALYSIS DIPSTICK
Bilirubin, UA: NEGATIVE
Glucose, UA: NEGATIVE
Ketones, UA: NEGATIVE
Leukocytes, UA: NEGATIVE
Nitrite, UA: NEGATIVE
Protein, UA: NEGATIVE
Spec Grav, UA: 1.01
Urobilinogen, UA: 0.2 U/dL
pH, UA: 5

## 2017-02-25 MED ORDER — OMEPRAZOLE 20 MG PO CPDR: 20.0000 mg | DELAYED_RELEASE_CAPSULE | Freq: Two times a day (BID) | ORAL | 3 refills | Status: DC

## 2017-02-25 MED ORDER — TOLTERODINE TARTRATE 2 MG PO TABS: 2.0000 mg | ORAL_TABLET | Freq: Two times a day (BID) | ORAL | 1 refills | Status: DC

## 2017-02-25 NOTE — Patient Instructions (Signed)
DASH Eating Plan DASH stands for "Dietary Approaches to Stop Hypertension." The DASH eating plan is a healthy eating plan that has been shown to reduce high blood pressure (hypertension). It may also reduce your risk for type 2 diabetes, heart disease, and stroke. The DASH eating plan may also help with weight loss. What are tips for following this plan? General guidelines  Avoid eating more than 2,300 mg (milligrams) of salt (sodium) a day. If you have hypertension, you may need to reduce your sodium intake to 1,500 mg a day.  Limit alcohol intake to no more than 1 drink a day for nonpregnant women and 2 drinks a day for men. One drink equals 12 oz of beer, 5 oz of wine, or 1 oz of hard liquor.  Work with your health care provider to maintain a healthy body weight or to lose weight. Ask what an ideal weight is for you.  Get at least 30 minutes of exercise that causes your heart to beat faster (aerobic exercise) most days of the week. Activities may include walking, swimming, or biking.  Work with your health care provider or diet and nutrition specialist (dietitian) to adjust your eating plan to your individual calorie needs. Reading food labels  Check food labels for the amount of sodium per serving. Choose foods with less than 5 percent of the Daily Value of sodium. Generally, foods with less than 300 mg of sodium per serving fit into this eating plan.  To find whole grains, look for the word "whole" as the first word in the ingredient list. Shopping  Buy products labeled as "low-sodium" or "no salt added."  Buy fresh foods. Avoid canned foods and premade or frozen meals. Cooking  Avoid adding salt when cooking. Use salt-free seasonings or herbs instead of table salt or sea salt. Check with your health care provider or pharmacist before using salt substitutes.  Do not fry foods. Cook foods using healthy methods such as baking, boiling, grilling, and broiling instead.  Cook with  heart-healthy oils, such as olive, canola, soybean, or sunflower oil. Meal planning   Eat a balanced diet that includes: ? 5 or more servings of fruits and vegetables each day. At each meal, try to fill half of your plate with fruits and vegetables. ? Up to 6-8 servings of whole grains each day. ? Less than 6 oz of lean meat, poultry, or fish each day. A 3-oz serving of meat is about the same size as a deck of cards. One egg equals 1 oz. ? 2 servings of low-fat dairy each day. ? A serving of nuts, seeds, or beans 5 times each week. ? Heart-healthy fats. Healthy fats called Omega-3 fatty acids are found in foods such as flaxseeds and coldwater fish, like sardines, salmon, and mackerel.  Limit how much you eat of the following: ? Canned or prepackaged foods. ? Food that is high in trans fat, such as fried foods. ? Food that is high in saturated fat, such as fatty meat. ? Sweets, desserts, sugary drinks, and other foods with added sugar. ? Full-fat dairy products.  Do not salt foods before eating.  Try to eat at least 2 vegetarian meals each week.  Eat more home-cooked food and less restaurant, buffet, and fast food.  When eating at a restaurant, ask that your food be prepared with less salt or no salt, if possible. What foods are recommended? The items listed may not be a complete list. Talk with your dietitian about what   dietary choices are best for you. Grains Whole-grain or whole-wheat bread. Whole-grain or whole-wheat pasta. Brown rice. Oatmeal. Quinoa. Bulgur. Whole-grain and low-sodium cereals. Pita bread. Low-fat, low-sodium crackers. Whole-wheat flour tortillas. Vegetables Fresh or frozen vegetables (raw, steamed, roasted, or grilled). Low-sodium or reduced-sodium tomato and vegetable juice. Low-sodium or reduced-sodium tomato sauce and tomato paste. Low-sodium or reduced-sodium canned vegetables. Fruits All fresh, dried, or frozen fruit. Canned fruit in natural juice (without  added sugar). Meat and other protein foods Skinless chicken or turkey. Ground chicken or turkey. Pork with fat trimmed off. Fish and seafood. Egg whites. Dried beans, peas, or lentils. Unsalted nuts, nut butters, and seeds. Unsalted canned beans. Lean cuts of beef with fat trimmed off. Low-sodium, lean deli meat. Dairy Low-fat (1%) or fat-free (skim) milk. Fat-free, low-fat, or reduced-fat cheeses. Nonfat, low-sodium ricotta or cottage cheese. Low-fat or nonfat yogurt. Low-fat, low-sodium cheese. Fats and oils Soft margarine without trans fats. Vegetable oil. Low-fat, reduced-fat, or light mayonnaise and salad dressings (reduced-sodium). Canola, safflower, olive, soybean, and sunflower oils. Avocado. Seasoning and other foods Herbs. Spices. Seasoning mixes without salt. Unsalted popcorn and pretzels. Fat-free sweets. What foods are not recommended? The items listed may not be a complete list. Talk with your dietitian about what dietary choices are best for you. Grains Baked goods made with fat, such as croissants, muffins, or some breads. Dry pasta or rice meal packs. Vegetables Creamed or fried vegetables. Vegetables in a cheese sauce. Regular canned vegetables (not low-sodium or reduced-sodium). Regular canned tomato sauce and paste (not low-sodium or reduced-sodium). Regular tomato and vegetable juice (not low-sodium or reduced-sodium). Pickles. Olives. Fruits Canned fruit in a light or heavy syrup. Fried fruit. Fruit in cream or butter sauce. Meat and other protein foods Fatty cuts of meat. Ribs. Fried meat. Bacon. Sausage. Bologna and other processed lunch meats. Salami. Fatback. Hotdogs. Bratwurst. Salted nuts and seeds. Canned beans with added salt. Canned or smoked fish. Whole eggs or egg yolks. Chicken or turkey with skin. Dairy Whole or 2% milk, cream, and half-and-half. Whole or full-fat cream cheese. Whole-fat or sweetened yogurt. Full-fat cheese. Nondairy creamers. Whipped toppings.  Processed cheese and cheese spreads. Fats and oils Butter. Stick margarine. Lard. Shortening. Ghee. Bacon fat. Tropical oils, such as coconut, palm kernel, or palm oil. Seasoning and other foods Salted popcorn and pretzels. Onion salt, garlic salt, seasoned salt, table salt, and sea salt. Worcestershire sauce. Tartar sauce. Barbecue sauce. Teriyaki sauce. Soy sauce, including reduced-sodium. Steak sauce. Canned and packaged gravies. Fish sauce. Oyster sauce. Cocktail sauce. Horseradish that you find on the shelf. Ketchup. Mustard. Meat flavorings and tenderizers. Bouillon cubes. Hot sauce and Tabasco sauce. Premade or packaged marinades. Premade or packaged taco seasonings. Relishes. Regular salad dressings. Where to find more information:  National Heart, Lung, and Blood Institute: www.nhlbi.nih.gov  American Heart Association: www.heart.org Summary  The DASH eating plan is a healthy eating plan that has been shown to reduce high blood pressure (hypertension). It may also reduce your risk for type 2 diabetes, heart disease, and stroke.  With the DASH eating plan, you should limit salt (sodium) intake to 2,300 mg a day. If you have hypertension, you may need to reduce your sodium intake to 1,500 mg a day.  When on the DASH eating plan, aim to eat more fresh fruits and vegetables, whole grains, lean proteins, low-fat dairy, and heart-healthy fats.  Work with your health care provider or diet and nutrition specialist (dietitian) to adjust your eating plan to your individual   calorie needs. This information is not intended to replace advice given to you by your health care provider. Make sure you discuss any questions you have with your health care provider. Document Released: 05/09/2011 Document Revised: 05/13/2016 Document Reviewed: 05/13/2016 Elsevier Interactive Patient Education  2017 Elsevier Inc.  

## 2017-02-25 NOTE — Progress Notes (Signed)
Date:  02/25/2017   Name:  Pamela Rosario   DOB:  1950-11-05   MRN:  [id number]   Chief Complaint: Annual Exam (Breast and Papsmear. ) and Immunizations (FLU SHOT- ) Pamela Rosario is a 66 y.o. female who presents today for her Complete Annual Exam. She feels fairly well. She reports exercising walking. She reports she is sleeping fairly well. Recent mammogram was normal.  No breast complaints.  Last Pap 2012 was normal.  Hypertension  This is a chronic problem. The problem is controlled. Pertinent negatives include no chest pain, headaches, palpitations or shortness of breath.  Diabetes  She presents for her follow-up diabetic visit. She has type 2 diabetes mellitus. Pertinent negatives for hypoglycemia include no dizziness, headaches, nervousness/anxiousness or tremors. Pertinent negatives for diabetes include no chest pain, no fatigue, no polydipsia and no polyuria. Current diabetic treatment includes oral agent (dual therapy). She is compliant with treatment all of the time. An ACE inhibitor/angiotensin II receptor blocker is being taken. Eye exam is current.  Hyperlipidemia  This is a chronic problem. The problem is controlled. Pertinent negatives include no chest pain or shortness of breath. Current antihyperlipidemic treatment includes statins.  Gastroesophageal Reflux  She reports no abdominal pain, no chest pain, no coughing or no wheezing. The problem occurs rarely. Pertinent negatives include no fatigue. She has tried a PPI for the symptoms.  Allergies - started desensitizations several months ago. Seeing Dr. Kathyrn Sheriff and will soon start doing injections at home.  Stress Incontinence - has noticed increase in stress incontinence very small volume of urine but requires her to wear a pad regularly. No symptoms of infection no bleeding no pain.  Thyroid nodules - has a history of tiny thyroid nodules being followed regularly by ultrasound. He brings a report that says the nodules appear  to be benign but recommended a follow-up in one year.  Review of Systems  Constitutional: Negative for chills, fatigue and fever.  HENT: Negative for congestion, hearing loss, tinnitus, trouble swallowing and voice change.   Eyes: Negative for visual disturbance.  Respiratory: Negative for cough, chest tightness, shortness of breath and wheezing.   Cardiovascular: Negative for chest pain, palpitations and leg swelling.  Gastrointestinal: Negative for abdominal pain, constipation, diarrhea and vomiting.  Endocrine: Negative for polydipsia and polyuria.  Genitourinary: Negative for dysuria, frequency, genital sores, vaginal bleeding and vaginal discharge.       Stress leakage  Musculoskeletal: Negative for arthralgias, gait problem and joint swelling.  Skin: Negative for color change and rash.  Allergic/Immunologic: Positive for environmental allergies.  Neurological: Negative for dizziness, tremors, light-headedness and headaches.  Hematological: Negative for adenopathy. Does not bruise/bleed easily.  Psychiatric/Behavioral: Negative for dysphoric mood and sleep disturbance. The patient is not nervous/anxious.     Patient Active Problem List   Diagnosis Date Noted  . Hiatal hernia with GERD without esophagitis 09/06/2016  . Fibrocystic breast changes of both breasts 09/06/2016  . Intraductal papilloma of breast, left 09/06/2016  . Mixed hyperlipidemia 09/06/2016  . Essential hypertension 09/06/2016  . Type 2 diabetes mellitus without complication, without long-term current use of insulin ([place]) 09/06/2016  . Thyroid nodule 09/06/2016  . Adenomatous colon polyp 09/06/2016    Prior to Admission medications   Medication Sig Start Date End Date Taking? Authorizing Provider  aspirin 81 MG chewable tablet Chew 81 mg by mouth daily.   Yes [provider]  atorvastatin (LIPITOR) 20 MG tablet Take 1 tablet (20 mg total) by mouth daily. 09/06/16  Yes Glean Hess, MD  fluticasone  [facility]) 50 MCG/ACT nasal spray Place 2 sprays into both nostrils daily.   Yes [provider]  glucose blood test strip 1 each by Other route as needed for other. Use as instructed   Yes [provider]  levocetirizine (XYZAL) 5 MG tablet Take 5 mg by mouth every evening.   Yes [provider]  lisinopril (PRINIVIL,ZESTRIL) 10 MG tablet Take 1 tablet (10 mg total) by mouth daily. 01/27/17  Yes Glean Hess, MD  metFORMIN (GLUCOPHAGE) 500 MG tablet Take 0.5 tablets (250 mg total) by mouth daily. 09/06/16  Yes Glean Hess, MD  Omeprazole 20 MG TBEC Take 1 tablet (20 mg total) by mouth 2 (two) times daily. 01/27/17  Yes Glean Hess, MD    Allergies  Allergen Reactions  . Demerol [Meperidine] Hives  . Tetracyclines & Related Hives  . Simvastatin Other (See Comments)    Muscle pain    Past Surgical History:  Procedure Laterality Date  . brachial cleft cyst excison ( Right Neck )  1971  . [place]   emergency  . COLONOSCOPY  08/24/2013   tubular adenoma  . ESOPHAGOGASTRODUODENOSCOPY  2005   hiatal hernia & polyp found  . EXCISION / BIOPSY BREAST / NIPPLE / DUCT Left 2005  . LARYNGOSCOPY  2006  . left intraductal papilloma excision of left breast  2007  . TONSILECTOMY, ADENOIDECTOMY, BILATERAL MYRINGOTOMY AND TUBES      Social History  Substance Use Topics  . Smoking status: Never Smoker  . Smokeless tobacco: Never Used  . Alcohol use No     Medication list has been reviewed and updated.  PHQ 2/9 Scores 01/27/2017 11/13/2016  PHQ - 2 Score 0 0    Physical Exam  Constitutional: She is oriented to person, place, and time. She appears well-developed and well-nourished. No distress.  HENT:  Head: Normocephalic and atraumatic.  Right Ear: Tympanic membrane and ear canal normal.  Left Ear: Tympanic membrane and ear canal normal.  Nose: Right sinus exhibits no maxillary sinus tenderness. Left sinus exhibits no maxillary sinus  tenderness.  Mouth/Throat: Uvula is midline and oropharynx is clear and moist.  Eyes: Conjunctivae and EOM are normal. Right eye exhibits no discharge. Left eye exhibits no discharge. No scleral icterus.  Neck: Normal range of motion. Neck supple. Carotid bruit is not present. No erythema present. No thyroid mass and no thyromegaly present.  Cardiovascular: Normal rate, regular rhythm, normal heart sounds and normal pulses.   Pulmonary/Chest: Effort normal and breath sounds normal. No respiratory distress. She has no wheezes. Right breast exhibits no mass, no nipple discharge, no skin change and no tenderness. Left breast exhibits no mass, no nipple discharge, no skin change and no tenderness.  Abdominal: Soft. Bowel sounds are normal. There is no hepatosplenomegaly. There is no tenderness. There is no CVA tenderness. Hernia confirmed negative in the right inguinal area and confirmed negative in the left inguinal area.  Genitourinary: Uterus normal. Cervix exhibits no motion tenderness, no discharge and no friability. Right adnexum displays no mass, no tenderness and no fullness. Left adnexum displays no mass, no tenderness and no fullness. No erythema, tenderness or bleeding in the vagina. No foreign body in the vagina. No signs of injury around the vagina. No vaginal discharge found.  Musculoskeletal: She exhibits no edema or tenderness.  Lymphadenopathy:    She has no cervical adenopathy.    She has no axillary adenopathy.  Neurological: She is alert and oriented to person, place, and time. She has normal reflexes. No cranial nerve deficit or sensory deficit.  Skin: Skin is warm, dry and intact. No rash noted.  Psychiatric: She has a normal mood and affect. Her speech is normal and behavior is normal. Thought content normal.  Nursing note and vitals reviewed.   BP 118/60 (BP Location: Right Arm, Patient Position: Sitting, Cuff Size: Normal)   Pulse 81   Ht 5\' 3"  (1.6 m)   Wt 177 lb (80.3 kg)    SpO2 97%   BMI 31.35 kg/m   Assessment and Plan: 1. Encounter for Papanicolaou smear for cervical cancer screening Normal exam - should not need any further Paps - Pap IG and HPV (high risk) DNA detection  2. Essential hypertension controlled - Comprehensive metabolic panel - POCT urinalysis dipstick  3. Hiatal hernia with GERD without esophagitis Continue daily PPI - omeprazole (PRILOSEC) 20 MG capsule; Take 1 capsule (20 mg total) by mouth 2 (two) times daily before a meal.  Dispense: 180 capsule; Refill: 3 - CBC with Differential/Platelet  4. Type 2 diabetes mellitus without complication, without long-term current use of insulin (HCC) Continue medications - Hemoglobin A1c - Microalbumin / creatinine urine ratio  5. Mixed hyperlipidemia On statin therapy - Lipid panel  6. Thyroid nodule Repeat US - TSH - US THYROID; Future  7. Need for influenza vaccination - Flu Vaccine QUAD 36+ mos IM  8. Need for hepatitis C screening test - Hepatitis C antibody  9. Stress incontinence of urine - tolterodine (DETROL) 2 MG tablet; Take 1 tablet (2 mg total) by mouth 2 (two) times daily.  Dispense: 60 tablet; Refill: 1   Meds ordered this encounter  Medications  . omeprazole (PRILOSEC) 20 MG capsule    Sig: Take 1 capsule (20 mg total) by mouth 2 (two) times daily before a meal.    Dispense:  180 capsule    Refill:  3  . tolterodine (DETROL) 2 MG tablet    Sig: Take 1 tablet (2 mg total) by mouth 2 (two) times daily.    Dispense:  60 tablet    Refill:  1   Rx written for Shingrix vaccines  Partially dictated using Editor, commissioning. Any errors are unintentional.  Halina Maidens, MD [facility]  02/25/2017

## 2017-02-26 LAB — CBC WITH DIFFERENTIAL/PLATELET
Basophils Absolute: 0 x10E3/uL (ref 0.0–0.2)
Basos: 0 %
EOS (ABSOLUTE): 0.1 x10E3/uL (ref 0.0–0.4)
Eos: 1 %
Hematocrit: 41.7 % (ref 34.0–46.6)
Hemoglobin: 13.1 g/dL (ref 11.1–15.9)
Immature Grans (Abs): 0 x10E3/uL (ref 0.0–0.1)
Immature Granulocytes: 0 %
Lymphocytes Absolute: 4 x10E3/uL — ABNORMAL HIGH (ref 0.7–3.1)
Lymphs: 42 %
MCH: 26.4 pg — ABNORMAL LOW (ref 26.6–33.0)
MCHC: 31.4 g/dL — ABNORMAL LOW (ref 31.5–35.7)
MCV: 84 fL (ref 79–97)
Monocytes Absolute: 0.8 x10E3/uL (ref 0.1–0.9)
Monocytes: 8 %
Neutrophils Absolute: 4.8 x10E3/uL (ref 1.4–7.0)
Neutrophils: 49 %
Platelets: 287 x10E3/uL (ref 150–379)
RBC: 4.97 x10E6/uL (ref 3.77–5.28)
RDW: 15.1 % (ref 12.3–15.4)
WBC: 9.7 x10E3/uL (ref 3.4–10.8)

## 2017-02-26 LAB — COMPREHENSIVE METABOLIC PANEL WITH GFR
ALT: 27 IU/L (ref 0–32)
AST: 20 IU/L (ref 0–40)
Albumin/Globulin Ratio: 1.8 (ref 1.2–2.2)
Albumin: 4.8 g/dL (ref 3.6–4.8)
Alkaline Phosphatase: 85 IU/L (ref 39–117)
BUN/Creatinine Ratio: 21 (ref 12–28)
BUN: 20 mg/dL (ref 8–27)
Bilirubin Total: 0.3 mg/dL (ref 0.0–1.2)
CO2: 22 mmol/L (ref 20–29)
Calcium: 9.1 mg/dL (ref 8.7–10.3)
Chloride: 102 mmol/L (ref 96–106)
Creatinine, Ser: 0.96 mg/dL (ref 0.57–1.00)
GFR calc Af Amer: 72 mL/min/1.73
GFR calc non Af Amer: 62 mL/min/1.73
Globulin, Total: 2.7 g/dL (ref 1.5–4.5)
Glucose: 62 mg/dL — ABNORMAL LOW (ref 65–99)
Potassium: 4.5 mmol/L (ref 3.5–5.2)
Sodium: 144 mmol/L (ref 134–144)
Total Protein: 7.5 g/dL (ref 6.0–8.5)

## 2017-02-26 LAB — LIPID PANEL
Chol/HDL Ratio: 4.1 ratio (ref 0.0–4.4)
Cholesterol, Total: 160 mg/dL (ref 100–199)
HDL: 39 mg/dL — ABNORMAL LOW
LDL Calculated: 90 mg/dL (ref 0–99)
Triglycerides: 156 mg/dL — ABNORMAL HIGH (ref 0–149)
VLDL Cholesterol Cal: 31 mg/dL (ref 5–40)

## 2017-02-26 LAB — HEPATITIS C ANTIBODY: Hep C Virus Ab: <0.1 s/co ratio (ref 0.0–0.9)

## 2017-02-26 LAB — TSH: TSH: 2.74 u[IU]/mL (ref 0.450–4.500)

## 2017-02-26 LAB — HEMOGLOBIN A1C
Est. average glucose Bld gHb Est-mCnc: 151 mg/dL
HEMOGLOBIN A1C: 6.9 % — AB (ref 4.8–5.6)

## 2017-02-28 ENCOUNTER — Ambulatory Visit
Admission: RE | Admit: 2017-02-28 | Discharge: 2017-02-28 | Disposition: A | Discharge status: Home / Self Care | Payer: Medicare Other | Source: Ambulatory Visit | Attending: Internal Medicine | Admitting: Internal Medicine

## 2017-02-28 DIAGNOSIS — E042 Nontoxic multinodular goiter: Secondary | ICD-10-CM | POA: Insufficient documentation

## 2017-02-28 DIAGNOSIS — E041 Nontoxic single thyroid nodule: Secondary | ICD-10-CM

## 2017-03-01 LAB — PAP IG AND HPV HIGH-RISK
HPV, HIGH-RISK: NEGATIVE
PAP SMEAR COMMENT: 0

## 2017-03-04 DIAGNOSIS — J301 Allergic rhinitis due to pollen: Secondary | ICD-10-CM | POA: Diagnosis not present

## 2017-03-13 ENCOUNTER — Other Ambulatory Visit: Payer: Self-pay | Admitting: Internal Medicine

## 2017-04-18 ENCOUNTER — Other Ambulatory Visit: Payer: Self-pay | Admitting: Internal Medicine

## 2017-04-18 DIAGNOSIS — N393 Stress incontinence (female) (male): Secondary | ICD-10-CM

## 2017-05-26 DIAGNOSIS — J301 Allergic rhinitis due to pollen: Secondary | ICD-10-CM | POA: Diagnosis not present

## 2017-05-28 DIAGNOSIS — J301 Allergic rhinitis due to pollen: Secondary | ICD-10-CM | POA: Diagnosis not present

## 2017-06-23 ENCOUNTER — Telehealth: Payer: Self-pay | Admitting: Internal Medicine

## 2017-06-23 NOTE — Telephone Encounter (Signed)
Called to schedule AWV with Nurse Health Advisor. please schedule AWV with NHA on 02/26/18 before physical Jill Alexanders [phone]  Skype [email or web address]

## 2017-07-07 ENCOUNTER — Encounter: Payer: Self-pay | Admitting: Internal Medicine

## 2017-07-07 ENCOUNTER — Ambulatory Visit (INDEPENDENT_AMBULATORY_CARE_PROVIDER_SITE_OTHER): Payer: Medicare Other | Admitting: Internal Medicine

## 2017-07-07 VITALS — BP 134/68 | HR 88 | Ht 63.0 in | Wt 181.0 lb

## 2017-07-07 DIAGNOSIS — I10 Essential (primary) hypertension: Secondary | ICD-10-CM | POA: Diagnosis not present

## 2017-07-07 DIAGNOSIS — E119 Type 2 diabetes mellitus without complications: Secondary | ICD-10-CM | POA: Diagnosis not present

## 2017-07-07 DIAGNOSIS — N393 Stress incontinence (female) (male): Secondary | ICD-10-CM | POA: Diagnosis not present

## 2017-07-07 DIAGNOSIS — K449 Diaphragmatic hernia without obstruction or gangrene: Secondary | ICD-10-CM

## 2017-07-07 DIAGNOSIS — K219 Gastro-esophageal reflux disease without esophagitis: Secondary | ICD-10-CM

## 2017-07-07 NOTE — Progress Notes (Signed)
Date:  07/07/2017   Name:  Pamela Rosario   DOB:  04-05-1951   MRN:  [id number]   Chief Complaint: Diabetes and Hypertension Diabetes  She presents for her follow-up diabetic visit. She has type 2 diabetes mellitus. Her disease course has been stable. Pertinent negatives for hypoglycemia include no headaches or tremors. Pertinent negatives for diabetes include no chest pain, no fatigue, no polydipsia and no polyuria. Symptoms are stable. Her weight is stable. She monitors blood glucose at home 1-2 x per day. Her breakfast blood glucose is taken between 6-7 am. Her breakfast blood glucose range is generally 90-110 mg/dl. An ACE inhibitor/angiotensin II receptor blocker is being taken.  Hypertension  This is a chronic problem. The problem is controlled. Pertinent negatives include no chest pain, headaches, palpitations or shortness of breath. Past treatments include ACE inhibitors.  Gastroesophageal Reflux  She reports no abdominal pain, no chest pain or no coughing. Pertinent negatives include no fatigue. She has tried a PPI for the symptoms. The treatment provided significant relief.  Allergies - started injections 6 months ago.  Cough does seen to be better.    Lab Results  Component Value Date   HGBA1C 6.9 (H) 02/25/2017     Review of Systems  Constitutional: Negative for appetite change, fatigue, fever and unexpected weight change.  HENT: Negative for tinnitus and trouble swallowing.   Eyes: Negative for visual disturbance.  Respiratory: Negative for cough, chest tightness and shortness of breath.   Cardiovascular: Negative for chest pain, palpitations and leg swelling.  Gastrointestinal: Negative for abdominal pain.  Endocrine: Negative for polydipsia and polyuria.  Genitourinary: Negative for dysuria and hematuria.  Musculoskeletal: Negative for arthralgias and back pain.  Skin: Negative for color change and rash.  Neurological: Negative for tremors, numbness and headaches.    Psychiatric/Behavioral: Negative for dysphoric mood and sleep disturbance.    Patient Active Problem List   Diagnosis Date Noted  . Stress incontinence of urine 02/25/2017  . Allergy desensitization therapy 01/20/2017  . Hiatal hernia with GERD without esophagitis 09/06/2016  . Fibrocystic breast changes of both breasts 09/06/2016  . Intraductal papilloma of breast, left 09/06/2016  . Mixed hyperlipidemia 09/06/2016  . Essential hypertension 09/06/2016  . Type 2 diabetes mellitus without complication, without long-term current use of insulin ([place]) 09/06/2016  . Thyroid nodule 09/06/2016  . Adenomatous colon polyp 09/06/2016    Prior to Admission medications   Medication Sig Start Date End Date Taking? Authorizing Provider  aspirin 81 MG chewable tablet Chew 81 mg by mouth daily.    [provider]  atorvastatin (LIPITOR) 20 MG tablet Take 1 tablet (20 mg total) by mouth daily. 09/06/16   Glean Hess, MD  fluticasone (FLONASE) 50 MCG/ACT nasal spray Place 2 sprays into both nostrils daily.    [provider]  glucose blood test strip 1 each by Other route as needed for other. Use as instructed    [provider]  levocetirizine (XYZAL) 5 MG tablet Take 5 mg by mouth every evening.    [provider]  lisinopril (PRINIVIL,ZESTRIL) 10 MG tablet Take 1 tablet (10 mg total) by mouth daily. 01/27/17   Glean Hess, MD  lisinopril (PRINIVIL,ZESTRIL) 10 MG tablet TAKE 1 TABLET (10 MG TOTAL) BY MOUTH DAILY. 03/13/17   Glean Hess, MD  metFORMIN (GLUCOPHAGE) 500 MG tablet Take 0.5 tablets (250 mg total) by mouth daily. 09/06/16   Glean Hess, MD  omeprazole (PRILOSEC) 20 MG capsule  Take 1 capsule (20 mg total) by mouth 2 (two) times daily before a meal. 02/25/17   Glean Hess, MD  tolterodine (DETROL) 2 MG tablet TAKE 1 TABLET (2 MG TOTAL) BY MOUTH 2 (TWO) TIMES DAILY. 04/19/17   Glean Hess, MD    Allergies  Allergen Reactions   . Demerol [Meperidine] Hives  . Tetracyclines & Related Hives  . Simvastatin Other (See Comments)    Muscle pain    Past Surgical History:  Procedure Laterality Date  . brachial cleft cyst excison ( Right Neck )  1971  . [place]   emergency  . COLONOSCOPY  08/24/2013   tubular adenoma  . ESOPHAGOGASTRODUODENOSCOPY  2005   hiatal hernia & polyp found  . EXCISION / BIOPSY BREAST / NIPPLE / DUCT Left 2005  . LARYNGOSCOPY  2006  . left intraductal papilloma excision of left breast  2007  . TONSILECTOMY, ADENOIDECTOMY, BILATERAL MYRINGOTOMY AND TUBES      Social History   Tobacco Use  . Smoking status: Never Smoker  . Smokeless tobacco: Never Used  Substance Use Topics  . Alcohol use: No  . Drug use: No     Medication list has been reviewed and updated.  PHQ 2/9 Scores 01/27/2017 11/13/2016  PHQ - 2 Score 0 0    Physical Exam  Constitutional: She is oriented to person, place, and time. She appears well-developed. No distress.  HENT:  Head: Normocephalic and atraumatic.  Neck: Normal range of motion. Neck supple.  Cardiovascular: Normal rate, regular rhythm and normal heart sounds.  Pulmonary/Chest: Effort normal and breath sounds normal. No respiratory distress. She has no wheezes.  Musculoskeletal: Normal range of motion. She exhibits no edema.  Neurological: She is alert and oriented to person, place, and time.  Skin: Skin is warm and dry. No rash noted.  Psychiatric: She has a normal mood and affect. Her behavior is normal. Thought content normal.  Nursing note and vitals reviewed.   BP 134/68   Pulse 88   Ht 5\' 3"  (1.6 m)   Wt 181 lb (82.1 kg)   SpO2 96%   BMI 32.06 kg/m   Assessment and Plan: 1. Essential hypertension controlled  2. Type 2 diabetes mellitus without complication, without long-term current use of insulin (HCC) Doing well with medication and diet - Hemoglobin A1c  3. Stress incontinence of urine Improved on [place]  4.  Hiatal hernia with GERD without esophagitis Continue PPI bid   No orders of the defined types were placed in this encounter.   Partially dictated using Editor, commissioning. Any errors are unintentional.  Halina Maidens, MD [facility]  07/07/2017

## 2017-07-08 ENCOUNTER — Other Ambulatory Visit: Payer: Self-pay

## 2017-07-08 LAB — HEMOGLOBIN A1C
Est. average glucose Bld gHb Est-mCnc: 151 mg/dL
HEMOGLOBIN A1C: 6.9 % — AB (ref 4.8–5.6)

## 2017-07-22 ENCOUNTER — Other Ambulatory Visit: Payer: Self-pay | Admitting: Internal Medicine

## 2017-07-29 ENCOUNTER — Encounter: Payer: Self-pay | Admitting: Internal Medicine

## 2017-07-29 ENCOUNTER — Ambulatory Visit (INDEPENDENT_AMBULATORY_CARE_PROVIDER_SITE_OTHER): Payer: Medicare Other | Admitting: Internal Medicine

## 2017-07-29 VITALS — BP 128/72 | HR 84 | Temp 98.0°F | Ht 63.0 in | Wt 181.0 lb

## 2017-07-29 DIAGNOSIS — N3000 Acute cystitis without hematuria: Secondary | ICD-10-CM | POA: Diagnosis not present

## 2017-07-29 LAB — POC URINALYSIS WITH MICROSCOPIC (NON AUTO)MANUAL RESULT
BILIRUBIN UA: NEGATIVE
Crystals: 0
EPITHELIAL CELLS, URINE PER MICROSCOPY: 0
GLUCOSE UA: NEGATIVE
KETONES UA: NEGATIVE
Mucus, UA: 0
NITRITE UA: POSITIVE
Protein, UA: NEGATIVE
RBC: 0 M/uL — AB (ref 4.04–5.48)
Spec Grav, UA: 1.02 (ref 1.010–1.025)
UROBILINOGEN UA: 0.2 U/dL
WBC Casts, UA: 30
pH, UA: 6 (ref 5.0–8.0)

## 2017-07-29 MED ORDER — NITROFURANTOIN MONOHYD MACRO 100 MG PO CAPS: 100.0000 mg | ORAL_CAPSULE | Freq: Two times a day (BID) | ORAL | 0 refills | Status: AC

## 2017-07-29 NOTE — Progress Notes (Signed)
Date:  07/29/2017   Name:  Pamela Rosario   DOB:  04-Mar-1951   MRN:  [id number]   Chief Complaint: Urinary Tract Infection (Burning, and frequency. no discharge/ fever. Almost a week)  Urinary Tract Infection   This is a new problem. The current episode started in the past 7 days. The problem has been unchanged. The patient is experiencing no pain. Associated symptoms include chills, frequency and urgency. Pertinent negatives include no discharge or hematuria.     Review of Systems  Constitutional: Positive for chills.  Genitourinary: Positive for frequency and urgency. Negative for hematuria.    Patient Active Problem List   Diagnosis Date Noted  . Stress incontinence of urine 02/25/2017  . Allergy desensitization therapy 01/20/2017  . Hiatal hernia with GERD without esophagitis 09/06/2016  . Fibrocystic breast changes of both breasts 09/06/2016  . Intraductal papilloma of breast, left 09/06/2016  . Mixed hyperlipidemia 09/06/2016  . Essential hypertension 09/06/2016  . Type 2 diabetes mellitus without complication, without long-term current use of insulin ([place]) 09/06/2016  . Thyroid nodule 09/06/2016  . Adenomatous colon polyp 09/06/2016    Prior to Admission medications   Medication Sig Start Date End Date Taking? Authorizing Provider  atorvastatin (LIPITOR) 20 MG tablet Take 1 tablet (20 mg total) by mouth daily. 09/06/16   Glean Hess, MD  fluticasone (FLONASE) 50 MCG/ACT nasal spray Place 2 sprays into both nostrils daily.    [provider]  glucose blood test strip 1 each by Other route as needed for other. Use as instructed    [provider]  ibuprofen (ADVIL,MOTRIN) 400 MG tablet Take 400 mg by mouth every 6 (six) hours as needed.    [provider]  levocetirizine (XYZAL) 5 MG tablet Take 5 mg by mouth every evening.    [provider]  lisinopril (PRINIVIL,ZESTRIL) 10 MG tablet TAKE 1 TABLET DAILY 07/22/17   Glean Hess, MD  metFORMIN (GLUCOPHAGE) 500 MG tablet Take 0.5 tablets (250 mg total) by mouth daily. 09/06/16   Glean Hess, MD  omeprazole (PRILOSEC) 20 MG capsule Take 1 capsule (20 mg total) by mouth 2 (two) times daily before a meal. 02/25/17   Glean Hess, MD  tolterodine (DETROL) 2 MG tablet TAKE 1 TABLET (2 MG TOTAL) BY MOUTH 2 (TWO) TIMES DAILY. 04/19/17   Glean Hess, MD  UNABLE TO [facility].  Started- 01/2017    [provider]    Allergies  Allergen Reactions  . Demerol [Meperidine] Hives  . Tetracyclines & Related Hives  . Simvastatin Other (See Comments)    Muscle pain    Past Surgical History:  Procedure Laterality Date  . brachial cleft cyst excison ( Right Neck )  1971  . [place]   emergency  . COLONOSCOPY  08/24/2013   tubular adenoma  . ESOPHAGOGASTRODUODENOSCOPY  2005   hiatal hernia & polyp found  . EXCISION / BIOPSY BREAST / NIPPLE / DUCT Left 2005  . LARYNGOSCOPY  2006  . left intraductal papilloma excision of left breast  2007  . TONSILECTOMY, ADENOIDECTOMY, BILATERAL MYRINGOTOMY AND TUBES      Social History   Tobacco Use  . Smoking status: Never Smoker  . Smokeless tobacco: Never Used  Substance Use Topics  . Alcohol use: No  . Drug use: No     Medication list has been reviewed and updated.  PHQ 2/9 Scores 01/27/2017 11/13/2016  PHQ - 2 Score 0 0    Physical Exam  Constitutional: She appears well-developed and well-nourished.  Cardiovascular: Normal rate, regular rhythm and normal heart sounds.  Pulmonary/Chest: Effort normal and breath sounds normal. No respiratory distress.  Abdominal: Soft. Bowel sounds are normal. There is tenderness in the suprapubic area. There is no rebound, no guarding and no CVA tenderness.  Psychiatric: She has a normal mood and affect.  Nursing note and vitals reviewed.   BP 128/72   Pulse 84   Temp 98 F (36.7 C) (Oral)   Ht 5\' 3"  (1.6 m)   Wt  181 lb (82.1 kg)   SpO2 94%   BMI 32.06 kg/m   Assessment and Plan: 1. Acute cystitis without hematuria Continue to push fluids - nitrofurantoin, macrocrystal-monohydrate, (MACROBID) 100 MG capsule; Take 1 capsule (100 mg total) by mouth 2 (two) times daily for 7 days.  Dispense: 14 capsule; Refill: 0 - POC urinalysis w microscopic (non auto)   Meds ordered this encounter  Medications  . nitrofurantoin, macrocrystal-monohydrate, (MACROBID) 100 MG capsule    Sig: Take 1 capsule (100 mg total) by mouth 2 (two) times daily for 7 days.    Dispense:  14 capsule    Refill:  0    Partially dictated using Editor, commissioning. Any errors are unintentional.  Halina Maidens, MD [facility]  07/29/2017

## 2017-07-29 NOTE — Patient Instructions (Signed)

## 2017-08-11 DIAGNOSIS — J301 Allergic rhinitis due to pollen: Secondary | ICD-10-CM | POA: Diagnosis not present

## 2017-08-12 ENCOUNTER — Other Ambulatory Visit: Payer: Self-pay

## 2017-08-12 DIAGNOSIS — N393 Stress incontinence (female) (male): Secondary | ICD-10-CM

## 2017-08-12 MED ORDER — TOLTERODINE TARTRATE 2 MG PO TABS: 2.0000 mg | ORAL_TABLET | Freq: Two times a day (BID) | ORAL | 5 refills | Status: DC

## 2017-08-28 DIAGNOSIS — J301 Allergic rhinitis due to pollen: Secondary | ICD-10-CM | POA: Diagnosis not present

## 2017-09-10 ENCOUNTER — Other Ambulatory Visit: Payer: Self-pay | Admitting: Internal Medicine

## 2017-09-10 DIAGNOSIS — Z1231 Encounter for screening mammogram for malignant neoplasm of breast: Secondary | ICD-10-CM

## 2017-09-22 ENCOUNTER — Other Ambulatory Visit: Payer: Self-pay | Admitting: Internal Medicine

## 2017-10-03 ENCOUNTER — Ambulatory Visit
Admission: RE | Admit: 2017-10-03 | Discharge: 2017-10-03 | Disposition: A | Discharge status: Home / Self Care | Payer: Medicare Other | Source: Ambulatory Visit | Attending: Internal Medicine | Admitting: Internal Medicine

## 2017-10-03 DIAGNOSIS — Z1231 Encounter for screening mammogram for malignant neoplasm of breast: Secondary | ICD-10-CM | POA: Diagnosis not present

## 2017-10-06 ENCOUNTER — Other Ambulatory Visit: Payer: Self-pay | Admitting: Internal Medicine

## 2017-10-06 DIAGNOSIS — R928 Other abnormal and inconclusive findings on diagnostic imaging of breast: Secondary | ICD-10-CM

## 2017-10-06 DIAGNOSIS — N6489 Other specified disorders of breast: Secondary | ICD-10-CM

## 2017-10-17 ENCOUNTER — Ambulatory Visit
Admission: RE | Admit: 2017-10-17 | Discharge: 2017-10-17 | Disposition: A | Discharge status: Home / Self Care | Payer: Medicare Other | Source: Ambulatory Visit | Attending: Internal Medicine | Admitting: Internal Medicine

## 2017-10-17 DIAGNOSIS — R928 Other abnormal and inconclusive findings on diagnostic imaging of breast: Secondary | ICD-10-CM

## 2017-10-17 DIAGNOSIS — N6489 Other specified disorders of breast: Secondary | ICD-10-CM

## 2017-10-17 DIAGNOSIS — N6002 Solitary cyst of left breast: Secondary | ICD-10-CM | POA: Diagnosis not present

## 2017-10-30 DIAGNOSIS — J301 Allergic rhinitis due to pollen: Secondary | ICD-10-CM | POA: Diagnosis not present

## 2017-11-11 DIAGNOSIS — J301 Allergic rhinitis due to pollen: Secondary | ICD-10-CM | POA: Diagnosis not present

## 2017-12-22 DIAGNOSIS — J301 Allergic rhinitis due to pollen: Secondary | ICD-10-CM | POA: Diagnosis not present

## 2018-01-21 ENCOUNTER — Other Ambulatory Visit: Payer: Self-pay | Admitting: Internal Medicine

## 2018-01-21 DIAGNOSIS — N393 Stress incontinence (female) (male): Secondary | ICD-10-CM

## 2018-01-21 DIAGNOSIS — J301 Allergic rhinitis due to pollen: Secondary | ICD-10-CM | POA: Diagnosis not present

## 2018-02-03 DIAGNOSIS — J301 Allergic rhinitis due to pollen: Secondary | ICD-10-CM | POA: Diagnosis not present

## 2018-02-06 ENCOUNTER — Other Ambulatory Visit: Payer: Self-pay | Admitting: Internal Medicine

## 2018-02-25 ENCOUNTER — Ambulatory Visit (INDEPENDENT_AMBULATORY_CARE_PROVIDER_SITE_OTHER): Payer: Medicare Other

## 2018-02-25 VITALS — BP 112/70 | HR 86 | Temp 97.9°F | Resp 12 | Ht 63.0 in | Wt 183.0 lb

## 2018-02-25 DIAGNOSIS — Z0000 Encounter for general adult medical examination without abnormal findings: Secondary | ICD-10-CM

## 2018-02-25 NOTE — Patient Instructions (Signed)
Pamela Rosario , Thank you for taking time to come for your Medicare Wellness Visit. I appreciate your ongoing commitment to your health goals. Please review the following plan we discussed and let me know if I can assist you in the future.   Screening recommendations/referrals: Colorectal Screening: Up to date Mammogram: Up to date Bone Density: Up to date  Vision and Dental Exams: Recommended annual ophthalmology exams for early detection of glaucoma and other disorders of the eye Recommended annual dental exams for proper oral hygiene  Diabetic Exams: Diabetic Eye Exam: Please call to schedule your appointment Diabetic Foot Exam: To be completed tomorrow  Vaccinations: Influenza vaccine: Declined Pneumococcal vaccine: Up to date Tdap vaccine: Up to date Shingles vaccine: Up to date  Advanced directives: Please bring a copy of your POA (Power of [place]) and/or Living Will to your next appointment.  Goals: Recommend to drink at least 6-8 8oz glasses of water per day.  Next appointment: Please schedule your Annual Wellness Visit with your Nurse Health Advisor in one year.  Preventive Care 20 Years and Older, Female Preventive care refers to lifestyle choices and visits with your health care provider that can promote health and wellness. What does preventive care include?  A yearly physical exam. This is also called an annual well check.  Dental exams once or twice a year.  Routine eye exams. Ask your health care provider how often you should have your eyes checked.  Personal lifestyle choices, including:  Daily care of your teeth and gums.  Regular physical activity.  Eating a healthy diet.  Avoiding tobacco and drug use.  Limiting alcohol use.  Practicing safe sex.  Taking low-dose aspirin every day if recommended by your health care provider.  Taking vitamin and mineral supplements as recommended by your health care provider. What happens during an annual well  check? The services and screenings done by your health care provider during your annual well check will depend on your age, overall health, lifestyle risk factors, and family history of disease. Counseling  Your health care provider may ask you questions about your:  Alcohol use.  Tobacco use.  Drug use.  Emotional well-being.  Home and relationship well-being.  Sexual activity.  Eating habits.  History of falls.  Memory and ability to understand (cognition).  Work and work Statistician.  Reproductive health. Screening  You may have the following tests or measurements:  Height, weight, and BMI.  Blood pressure.  Lipid and cholesterol levels. These may be checked every 5 years, or more frequently if you are over 39 years old.  Skin check.  Lung cancer screening. You may have this screening every year starting at age 61 if you have a 30-pack-year history of smoking and currently smoke or have quit within the past 15 years.  Fecal occult blood test (FOBT) of the stool. You may have this test every year starting at age 31.  Flexible sigmoidoscopy or colonoscopy. You may have a sigmoidoscopy every 5 years or a colonoscopy every 10 years starting at age 51.  Hepatitis C blood test.  Hepatitis B blood test.  Sexually transmitted disease (STD) testing.  Diabetes screening. This is done by checking your blood sugar (glucose) after you have not eaten for a while (fasting). You may have this done every 1-3 years.  Bone density scan. This is done to screen for osteoporosis. You may have this done starting at age 19.  Mammogram. This may be done every 1-2 years. Talk to your  health care provider about how often you should have regular mammograms. Talk with your health care provider about your test results, treatment options, and if necessary, the need for more tests. Vaccines  Your health care provider may recommend certain vaccines, such as:  Influenza vaccine. This is  recommended every year.  Tetanus, diphtheria, and acellular pertussis (Tdap, Td) vaccine. You may need a Td booster every 10 years.  Zoster vaccine. You may need this after age 44.  Pneumococcal 13-valent conjugate (PCV13) vaccine. One dose is recommended after age 53.  Pneumococcal polysaccharide (PPSV23) vaccine. One dose is recommended after age 26. Talk to your health care provider about which screenings and vaccines you need and how often you need them. This information is not intended to replace advice given to you by your health care provider. Make sure you discuss any questions you have with your health care provider. Document Released: 06/16/2015 Document Revised: 02/07/2016 Document Reviewed: 03/21/2015 Elsevier Interactive Patient Education  2017 [place] Prevention in the Home Falls can cause injuries. They can happen to people of all ages. There are many things you can do to make your home safe and to help prevent falls. What can I do on the outside of my home?  Regularly fix the edges of walkways and driveways and fix any cracks.  Remove anything that might make you trip as you walk through a door, such as a raised step or threshold.  Trim any bushes or trees on the path to your home.  Use bright outdoor lighting.  Clear any walking paths of anything that might make someone trip, such as rocks or tools.  Regularly check to see if handrails are loose or broken. Make sure that both sides of any steps have handrails.  Any raised decks and porches should have guardrails on the edges.  Have any leaves, snow, or ice cleared regularly.  Use sand or salt on walking paths during winter.  Clean up any spills in your garage right away. This includes oil or grease spills. What can I do in the bathroom?  Use night lights.  Install grab bars by the toilet and in the tub and shower. Do not use towel bars as grab bars.  Use non-skid mats or decals in the tub or  shower.  If you need to sit down in the shower, use a plastic, non-slip stool.  Keep the floor dry. Clean up any water that spills on the floor as soon as it happens.  Remove soap buildup in the tub or shower regularly.  Attach bath mats securely with double-sided non-slip rug tape.  Do not have throw rugs and other things on the floor that can make you trip. What can I do in the bedroom?  Use night lights.  Make sure that you have a light by your bed that is easy to reach.  Do not use any sheets or blankets that are too big for your bed. They should not hang down onto the floor.  Have a firm chair that has side arms. You can use this for support while you get dressed.  Do not have throw rugs and other things on the floor that can make you trip. What can I do in the kitchen?  Clean up any spills right away.  Avoid walking on wet floors.  Keep items that you use a lot in easy-to-reach places.  If you need to reach something above you, use a strong step stool that has a  grab bar.  Keep electrical cords out of the way.  Do not use floor polish or wax that makes floors slippery. If you must use wax, use non-skid floor wax.  Do not have throw rugs and other things on the floor that can make you trip. What can I do with my stairs?  Do not leave any items on the stairs.  Make sure that there are handrails on both sides of the stairs and use them. Fix handrails that are broken or loose. Make sure that handrails are as long as the stairways.  Check any carpeting to make sure that it is firmly attached to the stairs. Fix any carpet that is loose or worn.  Avoid having throw rugs at the top or bottom of the stairs. If you do have throw rugs, attach them to the floor with carpet tape.  Make sure that you have a light switch at the top of the stairs and the bottom of the stairs. If you do not have them, ask someone to add them for you. What else can I do to help prevent  falls?  Wear shoes that:  Do not have high heels.  Have rubber bottoms.  Are comfortable and fit you well.  Are closed at the toe. Do not wear sandals.  If you use a stepladder:  Make sure that it is fully opened. Do not climb a closed stepladder.  Make sure that both sides of the stepladder are locked into place.  Ask someone to hold it for you, if possible.  Clearly mark and make sure that you can see:  Any grab bars or handrails.  First and last steps.  Where the edge of each step is.  Use tools that help you move around (mobility aids) if they are needed. These include:  Canes.  Walkers.  Scooters.  Crutches.  Turn on the lights when you go into a dark area. Replace any light bulbs as soon as they burn out.  Set up your furniture so you have a clear path. Avoid moving your furniture around.  If any of your floors are uneven, fix them.  If there are any pets around you, be aware of where they are.  Review your medicines with your doctor. Some medicines can make you feel dizzy. This can increase your chance of falling. Ask your doctor what other things that you can do to help prevent falls. This information is not intended to replace advice given to you by your health care provider. Make sure you discuss any questions you have with your health care provider. Document Released: 03/16/2009 Document Revised: 10/26/2015 Document Reviewed: 06/24/2014 Elsevier Interactive Patient Education  2017 [employer].
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
## 2018-02-26 ENCOUNTER — Encounter: Payer: Self-pay | Admitting: Internal Medicine

## 2018-02-26 ENCOUNTER — Ambulatory Visit (INDEPENDENT_AMBULATORY_CARE_PROVIDER_SITE_OTHER): Payer: Medicare Other | Admitting: Internal Medicine

## 2018-02-26 VITALS — BP 128/76 | HR 99 | Ht 63.0 in | Wt 183.0 lb

## 2018-02-26 DIAGNOSIS — N393 Stress incontinence (female) (male): Secondary | ICD-10-CM

## 2018-02-26 DIAGNOSIS — E119 Type 2 diabetes mellitus without complications: Secondary | ICD-10-CM | POA: Diagnosis not present

## 2018-02-26 DIAGNOSIS — K449 Diaphragmatic hernia without obstruction or gangrene: Secondary | ICD-10-CM | POA: Diagnosis not present

## 2018-02-26 DIAGNOSIS — I10 Essential (primary) hypertension: Secondary | ICD-10-CM

## 2018-02-26 DIAGNOSIS — Z23 Encounter for immunization: Secondary | ICD-10-CM | POA: Diagnosis not present

## 2018-02-26 DIAGNOSIS — K219 Gastro-esophageal reflux disease without esophagitis: Secondary | ICD-10-CM

## 2018-02-26 DIAGNOSIS — E782 Mixed hyperlipidemia: Secondary | ICD-10-CM

## 2018-02-26 LAB — POCT URINALYSIS DIPSTICK
Bilirubin, UA: NEGATIVE
GLUCOSE UA: NEGATIVE
Ketones, UA: NEGATIVE
NITRITE UA: NEGATIVE
PH UA: 6 (ref 5.0–8.0)
PROTEIN UA: NEGATIVE
SPEC GRAV UA: 1.02 (ref 1.010–1.025)
UROBILINOGEN UA: 0.2 U/dL

## 2018-02-26 MED ORDER — METFORMIN HCL 500 MG PO TABS: 250.0000 mg | ORAL_TABLET | Freq: Every day | ORAL | 3 refills | Status: DC

## 2018-02-26 MED ORDER — OMEPRAZOLE 20 MG PO CPDR: 20.0000 mg | DELAYED_RELEASE_CAPSULE | Freq: Two times a day (BID) | ORAL | 3 refills | Status: DC

## 2018-02-26 NOTE — Patient Instructions (Signed)
Will give Prevnar-13 next visit  Health Maintenance  Topic Date Due  . HEMOGLOBIN A1C  Done today  . OPHTHALMOLOGY EXAM  01/30/2018  . FOOT EXAM  Done today  . INFLUENZA VACCINE  02/26/2018 (Originally 01/01/2018)  . PNA vac Low Risk Adult (2 of 2 - PPSV23) Prevnar-13 next visit  . COLONOSCOPY  08/25/2018  . MAMMOGRAM  10/18/2018  . TETANUS/TDAP  06/03/2022  . DEXA SCAN  Completed  . Hepatitis C Screening  Completed

## 2018-02-26 NOTE — Progress Notes (Signed)
Date:  02/26/2018   Name:  Pamela Rosario   DOB:  1950/09/01   MRN:  [id number]   Chief Complaint: Hypertension and Diabetes HM - seen for MAW yesterday.  Up to date on mammogram, colonoscopy, DEXA.  Recently got Shingrix.  PPV-23 in 2011.  Due for Prevnar-13. Hypertension  This is a chronic problem. The problem is unchanged. The problem is controlled. Pertinent negatives include no chest pain, headaches, palpitations or shortness of breath.  Diabetes  She presents for her follow-up diabetic visit. She has type 2 diabetes mellitus. Her disease course has been stable. Pertinent negatives for hypoglycemia include no dizziness, headaches, nervousness/anxiousness or tremors. Pertinent negatives for diabetes include no chest pain, no fatigue, no polydipsia and no polyuria. Current diabetic treatment includes diet and oral agent (monotherapy). She monitors blood glucose at home 1-2 x per week. Her breakfast blood glucose is taken between 7-8 am. Her breakfast blood glucose range is generally 110-130 mg/dl. An ACE inhibitor/angiotensin II receptor blocker is being taken. Eye exam is current.  Hyperlipidemia  This is a chronic problem. Pertinent negatives include no chest pain or shortness of breath. Current antihyperlipidemic treatment includes statins. The current treatment provides significant improvement of lipids.  Gastroesophageal Reflux  She complains of heartburn. She reports no abdominal pain, no chest pain, no coughing or no wheezing. The problem occurs occasionally. Pertinent negatives include no fatigue. She has tried a PPI for the symptoms. The treatment provided significant relief.  Allergies - getting allergy injections weekly at home.  It has helped her sx, along with Flonase and Xyzal.  Lab Results  Component Value Date   HGBA1C 6.9 (H) 07/07/2017   Lab Results  Component Value Date   CREATININE 0.96 02/25/2017   BUN 20 02/25/2017   NA 144 02/25/2017   K 4.5 02/25/2017   CL 102  02/25/2017   CO2 22 02/25/2017   Lab Results  Component Value Date   CHOL 160 02/25/2017   HDL 39 (L) 02/25/2017   LDLCALC 90 02/25/2017   TRIG 156 (H) 02/25/2017   CHOLHDL 4.1 02/25/2017      Review of Systems  Constitutional: Negative for chills, fatigue and fever.  HENT: Negative for congestion, hearing loss, tinnitus, trouble swallowing and voice change.   Eyes: Negative for visual disturbance.  Respiratory: Negative for cough, chest tightness, shortness of breath and wheezing.   Cardiovascular: Negative for chest pain, palpitations and leg swelling.  Gastrointestinal: Positive for heartburn. Negative for abdominal pain, constipation, diarrhea and vomiting.  Endocrine: Negative for polydipsia and polyuria.  Genitourinary: Negative for dysuria, frequency, genital sores, vaginal bleeding and vaginal discharge.  Musculoskeletal: Negative for arthralgias, gait problem and joint swelling.  Skin: Negative for color change and rash.  Neurological: Negative for dizziness, tremors, light-headedness and headaches.  Hematological: Negative for adenopathy. Does not bruise/bleed easily.  Psychiatric/Behavioral: Negative for dysphoric mood and sleep disturbance. The patient is not nervous/anxious.     Patient Active Problem List   Diagnosis Date Noted  . Stress incontinence of urine 02/25/2017  . Allergy desensitization therapy 01/20/2017  . Hiatal hernia with GERD without esophagitis 09/06/2016  . Fibrocystic breast changes of both breasts 09/06/2016  . Intraductal papilloma of breast, left 09/06/2016  . Mixed hyperlipidemia 09/06/2016  . Essential hypertension 09/06/2016  . Type 2 diabetes mellitus without complication, without long-term current use of insulin ([place]) 09/06/2016  . Thyroid nodule 09/06/2016  . Adenomatous colon polyp 09/06/2016    Allergies  Allergen Reactions  .  Demerol [Meperidine] Hives  . Tetracyclines & Related Hives  . Simvastatin Other (See Comments)     Muscle pain    Past Surgical History:  Procedure Laterality Date  . brachial cleft cyst excison ( Right Neck )  1971  . BREAST EXCISIONAL BIOPSY Left    intraductal papilloma  . [place]   emergency  . COLONOSCOPY  08/24/2013   tubular adenoma  . ESOPHAGOGASTRODUODENOSCOPY  2005   hiatal hernia & polyp found  . EXCISION / BIOPSY BREAST / NIPPLE / DUCT Left 2005  . LARYNGOSCOPY  2006  . left intraductal papilloma excision of left breast  2007  . TONSILECTOMY, ADENOIDECTOMY, BILATERAL MYRINGOTOMY AND TUBES      Social History   Tobacco Use  . Smoking status: Never Smoker  . Smokeless tobacco: Never Used  . Tobacco comment: smoking cessation materials not required  Substance Use Topics  . Alcohol use: No  . Drug use: No     Medication list has been reviewed and updated.  Current Meds  Medication Sig  . atorvastatin (LIPITOR) 20 MG tablet TAKE 1 TABLET (20 MG TOTAL) BY MOUTH DAILY.  . fluticasone (FLONASE) 50 MCG/ACT nasal spray Place 2 sprays into both nostrils daily.  Marland Kitchen glucose blood test strip 1 each by Other route as needed for other. Use as instructed  . ibuprofen (ADVIL,MOTRIN) 400 MG tablet Take 400 mg by mouth every 6 (six) hours as needed.  Marland Kitchen levocetirizine (XYZAL) 5 MG tablet Take 5 mg by mouth every evening.  Marland Kitchen lisinopril (PRINIVIL,ZESTRIL) 10 MG tablet TAKE 1 TABLET DAILY  . metFORMIN (GLUCOPHAGE) 500 MG tablet TAKE 0.5 TABLETS (250 MG TOTAL) BY MOUTH DAILY.  Marland Kitchen omeprazole (PRILOSEC) 20 MG capsule Take 1 capsule (20 mg total) by mouth 2 (two) times daily before a meal.  . tolterodine (DETROL) 2 MG tablet TAKE 1 TABLET (2 MG TOTAL) BY MOUTH 2 (TWO) TIMES DAILY.  Marland Kitchen UNABLE TO FIND Allergy Shots- [facility].  Started- 01/2017    PHQ 2/9 Scores 02/25/2018 01/27/2017 11/13/2016  PHQ - 2 Score 0 0 0  PHQ- 9 Score 0 - -    Physical Exam  Constitutional: She is oriented to person, place, and time. She appears well-developed and  well-nourished. No distress.  HENT:  Head: Normocephalic and atraumatic.  Right Ear: Tympanic membrane and ear canal normal.  Left Ear: Tympanic membrane and ear canal normal.  Nose: Right sinus exhibits no maxillary sinus tenderness. Left sinus exhibits no maxillary sinus tenderness.  Mouth/Throat: Uvula is midline and oropharynx is clear and moist.  Eyes: Conjunctivae and EOM are normal. Right eye exhibits no discharge. Left eye exhibits no discharge. No scleral icterus.  Neck: Normal range of motion. Carotid bruit is not present. No erythema present. No thyromegaly present.  Cardiovascular: Normal rate, regular rhythm, normal heart sounds and normal pulses.  Pulmonary/Chest: Effort normal. No respiratory distress. She has no wheezes. Right breast exhibits no mass, no nipple discharge, no skin change and no tenderness. Left breast exhibits no mass, no nipple discharge, no skin change and no tenderness.  Abdominal: Soft. Bowel sounds are normal. There is no hepatosplenomegaly. There is no tenderness. There is no CVA tenderness.  Musculoskeletal: Normal range of motion.  Lymphadenopathy:    She has no cervical adenopathy.    She has no axillary adenopathy.  Neurological: She is alert and oriented to person, place, and time. She has normal reflexes. No cranial nerve deficit or sensory deficit.  Skin: Skin is warm, dry and intact. No rash noted.  Psychiatric: She has a normal mood and affect. Her speech is normal and behavior is normal. Thought content normal.  Nursing note and vitals reviewed.   BP 128/76 (BP Location: Right Arm, Patient Position: Sitting, Cuff Size: Normal)   Pulse 99   Ht 5\' 3"  (1.6 m)   Wt 183 lb (83 kg)   SpO2 96%   BMI 32.42 kg/m   Assessment and Plan: 1. Essential hypertension controlled - CBC with Differential/Platelet - TSH  2. Hiatal hernia with GERD without esophagitis Continue PPI - omeprazole (PRILOSEC) 20 MG capsule; Take 1 capsule (20 mg total) by  mouth 2 (two) times daily before a meal.  Dispense: 180 capsule; Refill: 3 - CBC with Differential/Platelet  3. Type 2 diabetes mellitus without complication, without long-term current use of insulin (HCC) Continue diet, add regular exercise Prevnar-13 next visit - metFORMIN (GLUCOPHAGE) 500 MG tablet; Take 0.5 tablets (250 mg total) by mouth daily.  Dispense: 45 tablet; Refill: 3 - Comprehensive metabolic panel - Hemoglobin A1c  4. Mixed hyperlipidemia On statin therapy - Lipid panel  5. Stress incontinence of urine Doing well on Detrol - POCT urinalysis dipstick  6. Need for influenza vaccination - Flu vaccine HIGH DOSE PF   Partially dictated using Editor, commissioning. Any errors are unintentional.  Halina Maidens, MD [facility]  02/26/2018

## 2018-02-27 LAB — CBC WITH DIFFERENTIAL/PLATELET
BASOS: 0 %
Basophils Absolute: 0 10*3/uL (ref 0.0–0.2)
EOS (ABSOLUTE): 0.1 10*3/uL (ref 0.0–0.4)
EOS: 1 %
HEMATOCRIT: 41.1 % (ref 34.0–46.6)
HEMOGLOBIN: 13 g/dL (ref 11.1–15.9)
IMMATURE GRANS (ABS): 0 10*3/uL (ref 0.0–0.1)
IMMATURE GRANULOCYTES: 0 %
LYMPHS: 37 %
Lymphocytes Absolute: 2.3 10*3/uL (ref 0.7–3.1)
MCH: 26.2 pg — AB (ref 26.6–33.0)
MCHC: 31.6 g/dL (ref 31.5–35.7)
MCV: 83 fL (ref 79–97)
MONOCYTES: 8 %
Monocytes Absolute: 0.5 10*3/uL (ref 0.1–0.9)
NEUTROS ABS: 3.3 10*3/uL (ref 1.4–7.0)
NEUTROS PCT: 54 %
PLATELETS: 225 10*3/uL (ref 150–450)
RBC: 4.97 x10E6/uL (ref 3.77–5.28)
RDW: 14.2 % (ref 12.3–15.4)
WBC: 6.2 10*3/uL (ref 3.4–10.8)

## 2018-02-27 LAB — COMPREHENSIVE METABOLIC PANEL
A/G RATIO: 1.5 (ref 1.2–2.2)
ALBUMIN: 4.3 g/dL (ref 3.6–4.8)
ALT: 31 IU/L (ref 0–32)
AST: 23 IU/L (ref 0–40)
Alkaline Phosphatase: 83 IU/L (ref 39–117)
BUN / CREAT RATIO: 24 (ref 12–28)
BUN: 22 mg/dL (ref 8–27)
Bilirubin Total: 0.3 mg/dL (ref 0.0–1.2)
CO2: 21 mmol/L (ref 20–29)
Calcium: 9.2 mg/dL (ref 8.7–10.3)
Chloride: 104 mmol/L (ref 96–106)
Creatinine, Ser: 0.9 mg/dL (ref 0.57–1.00)
GFR, EST AFRICAN AMERICAN: 77 mL/min/{1.73_m2} (ref >59)
GFR, EST NON AFRICAN AMERICAN: 67 mL/min/{1.73_m2} (ref >59)
Globulin, Total: 2.8 g/dL (ref 1.5–4.5)
Glucose: 133 mg/dL — ABNORMAL HIGH (ref 65–99)
POTASSIUM: 4.8 mmol/L (ref 3.5–5.2)
Sodium: 142 mmol/L (ref 134–144)
TOTAL PROTEIN: 7.1 g/dL (ref 6.0–8.5)

## 2018-02-27 LAB — TSH: TSH: 2.45 u[IU]/mL (ref 0.450–4.500)

## 2018-02-27 LAB — LIPID PANEL
CHOLESTEROL TOTAL: 143 mg/dL (ref 100–199)
Chol/HDL Ratio: 4.1 ratio (ref 0.0–4.4)
HDL: 35 mg/dL — ABNORMAL LOW (ref >39)
LDL CALC: 62 mg/dL (ref 0–99)
TRIGLYCERIDES: 229 mg/dL — AB (ref 0–149)
VLDL CHOLESTEROL CAL: 46 mg/dL — AB (ref 5–40)

## 2018-02-27 LAB — HEMOGLOBIN A1C
Est. average glucose Bld gHb Est-mCnc: 146 mg/dL
Hgb A1c MFr Bld: 6.7 % — ABNORMAL HIGH (ref 4.8–5.6)

## 2018-03-18 ENCOUNTER — Encounter: Payer: Self-pay | Admitting: Internal Medicine

## 2018-03-18 ENCOUNTER — Ambulatory Visit (INDEPENDENT_AMBULATORY_CARE_PROVIDER_SITE_OTHER): Payer: Medicare Other | Admitting: Internal Medicine

## 2018-03-18 VITALS — BP 132/78 | HR 96 | Temp 98.4°F | Ht 63.0 in | Wt 184.0 lb

## 2018-03-18 DIAGNOSIS — J0100 Acute maxillary sinusitis, unspecified: Secondary | ICD-10-CM

## 2018-03-18 DIAGNOSIS — N3000 Acute cystitis without hematuria: Secondary | ICD-10-CM

## 2018-03-18 LAB — POC URINALYSIS WITH MICROSCOPIC (NON AUTO)MANUAL RESULT
Bilirubin, UA: NEGATIVE
CRYSTALS: 0
Epithelial cells, urine per micros: 0
GLUCOSE UA: NEGATIVE
KETONES UA: NEGATIVE
Mucus, UA: 0
Nitrite, UA: NEGATIVE
Protein, UA: NEGATIVE
RBC UA: NEGATIVE
RBC: 0 M/uL — AB (ref 4.04–5.48)
Urobilinogen, UA: 0.2 E.U./dL
WBC Casts, UA: 5
pH, UA: 6 (ref 5.0–8.0)

## 2018-03-18 MED ORDER — AMOXICILLIN-POT CLAVULANATE 875-125 MG PO TABS: 1.0000 | ORAL_TABLET | Freq: Two times a day (BID) | ORAL | 0 refills | Status: AC

## 2018-03-18 NOTE — Progress Notes (Signed)
Date:  03/18/2018   Name:  Pamela Rosario   DOB:  08/16/1950   MRN:  [id number]   Chief Complaint: Cough (1 week. Cough with clear production. No fever or headache. ) and Dysuria (Burning and urgency started yesterday. )  Cough  This is a new problem. The current episode started in the past 7 days. The problem occurs every few hours. The cough is non-productive. Associated symptoms include nasal congestion, postnasal drip and rhinorrhea. Pertinent negatives include no chills or fever. She has tried OTC cough suppressant (flonase and xyzal) for the symptoms. Her past medical history is significant for environmental allergies.  Dysuria   This is a new problem. The current episode started yesterday. The problem has been unchanged. The quality of the pain is described as burning. The pain is mild. There has been no fever. Associated symptoms include frequency and urgency. Pertinent negatives include no chills, flank pain or hematuria.    Review of Systems  Constitutional: Negative for chills and fever.  HENT: Positive for postnasal drip and rhinorrhea.   Respiratory: Positive for cough.   Genitourinary: Positive for dysuria, frequency and urgency. Negative for flank pain and hematuria.  Allergic/Immunologic: Positive for environmental allergies.    Patient Active Problem List   Diagnosis Date Noted  . Stress incontinence of urine 02/25/2017  . Allergy desensitization therapy 01/20/2017  . Hiatal hernia with GERD without esophagitis 09/06/2016  . Fibrocystic breast changes of both breasts 09/06/2016  . Intraductal papilloma of breast, left 09/06/2016  . Mixed hyperlipidemia 09/06/2016  . Essential hypertension 09/06/2016  . Type 2 diabetes mellitus without complication, without long-term current use of insulin ([place]) 09/06/2016  . Thyroid nodule 09/06/2016  . Adenomatous colon polyp 09/06/2016    Allergies  Allergen Reactions  . Demerol [Meperidine] Hives  . Tetracyclines & Related  Hives  . Simvastatin Other (See Comments)    Muscle pain    Past Surgical History:  Procedure Laterality Date  . brachial cleft cyst excison ( Right Neck )  1971  . BREAST EXCISIONAL BIOPSY Left    intraductal papilloma  . [place]   emergency  . COLONOSCOPY  08/24/2013   tubular adenoma  . ESOPHAGOGASTRODUODENOSCOPY  2005   hiatal hernia & polyp found  . EXCISION / BIOPSY BREAST / NIPPLE / DUCT Left 2005  . LARYNGOSCOPY  2006  . left intraductal papilloma excision of left breast  2007  . TONSILECTOMY, ADENOIDECTOMY, BILATERAL MYRINGOTOMY AND TUBES      Social History   Tobacco Use  . Smoking status: Never Smoker  . Smokeless tobacco: Never Used  . Tobacco comment: smoking cessation materials not required  Substance Use Topics  . Alcohol use: No  . Drug use: No     Medication list has been reviewed and updated.  Current Meds  Medication Sig  . atorvastatin (LIPITOR) 20 MG tablet TAKE 1 TABLET (20 MG TOTAL) BY MOUTH DAILY.  . fluticasone (FLONASE) 50 MCG/ACT nasal spray Place 2 sprays into both nostrils daily.  Marland Kitchen glucose blood test strip 1 each by Other route as needed for other. Use as instructed  . ibuprofen (ADVIL,MOTRIN) 400 MG tablet Take 400 mg by mouth every 6 (six) hours as needed.  Marland Kitchen levocetirizine (XYZAL) 5 MG tablet Take 5 mg by mouth every evening.  Marland Kitchen lisinopril (PRINIVIL,ZESTRIL) 10 MG tablet TAKE 1 TABLET DAILY  . metFORMIN (GLUCOPHAGE) 500 MG tablet Take 0.5 tablets (250 mg total) by mouth daily.  Marland Kitchen omeprazole (  PRILOSEC) 20 MG capsule Take 1 capsule (20 mg total) by mouth 2 (two) times daily before a meal.  . tolterodine (DETROL) 2 MG tablet TAKE 1 TABLET (2 MG TOTAL) BY MOUTH 2 (TWO) TIMES DAILY.  Marland Kitchen UNABLE TO FIND Allergy Shots- [facility].  Started- 01/2017    PHQ 2/9 Scores 02/25/2018 01/27/2017 11/13/2016  PHQ - 2 Score 0 0 0  PHQ- 9 Score 0 - -    Physical Exam  Constitutional: She is oriented to person, place, and  time. She appears well-developed. No distress.  HENT:  Head: Normocephalic and atraumatic.  Right Ear: Tympanic membrane and ear canal normal.  Left Ear: Tympanic membrane and ear canal normal.  Nose: Right sinus exhibits maxillary sinus tenderness. Left sinus exhibits maxillary sinus tenderness.  Mouth/Throat: Posterior oropharyngeal erythema present.  Cardiovascular: Normal rate, regular rhythm and normal heart sounds.  Pulmonary/Chest: Effort normal and breath sounds normal. No respiratory distress. She has no wheezes. She has no rhonchi.  Abdominal: Soft. There is tenderness in the suprapubic area. There is no rigidity and no guarding.  Musculoskeletal: Normal range of motion.  Neurological: She is alert and oriented to person, place, and time.  Skin: Skin is warm and dry. No rash noted.  Psychiatric: She has a normal mood and affect. Her behavior is normal. Thought content normal.  Nursing note and vitals reviewed.   BP 132/78 (BP Location: Right Arm, Patient Position: Sitting, Cuff Size: Normal)   Pulse 96   Temp 98.4 F (36.9 C) (Oral)   Ht 5\' 3"  (1.6 m)   Wt 184 lb (83.5 kg)   SpO2 98%   BMI 32.59 kg/m   Assessment and Plan: 1. Acute cystitis without hematuria Increase fluid intake - POC urinalysis w microscopic (non auto) - amoxicillin-clavulanate (AUGMENTIN) 875-125 MG tablet; Take 1 tablet by mouth 2 (two) times daily for 10 days.  Dispense: 20 tablet; Refill: 0  2. Acute non-recurrent maxillary sinusitis Continue xyzal, flonase and Dayquil Any infection should also be covered by Augmentin   Partially dictated using Editor, commissioning. Any errors are unintentional.  Halina Maidens, MD [facility]  03/18/2018

## 2018-03-26 IMAGING — US US THYROID
1 series · 14 of 25 positions shown · non-contrast
Comparison: None.

CLINICAL DATA: Nodules identified on outside study, images not
available.

EXAM:
THYROID ULTRASOUND
TECHNIQUE: Ultrasound examination of the thyroid gland and adjacent soft
tissues was performed.

[Series 1: us thyroid · 0.07mm/px · 14 of 43 slices shown]
[im 1/43]
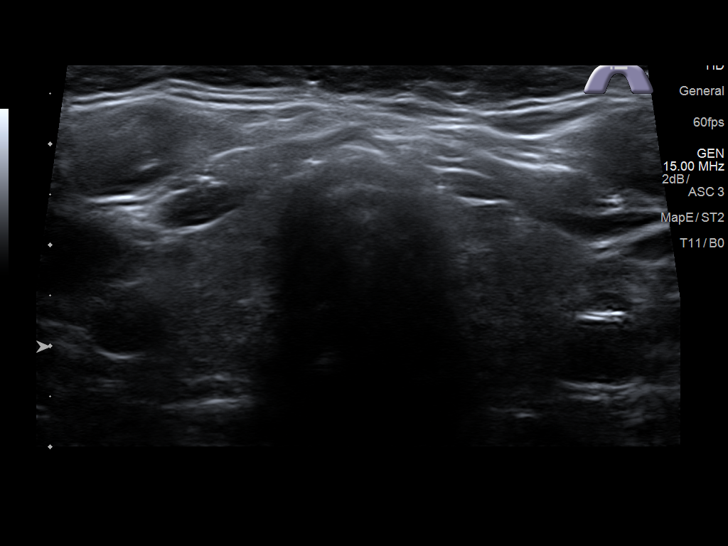
[im 4/43]
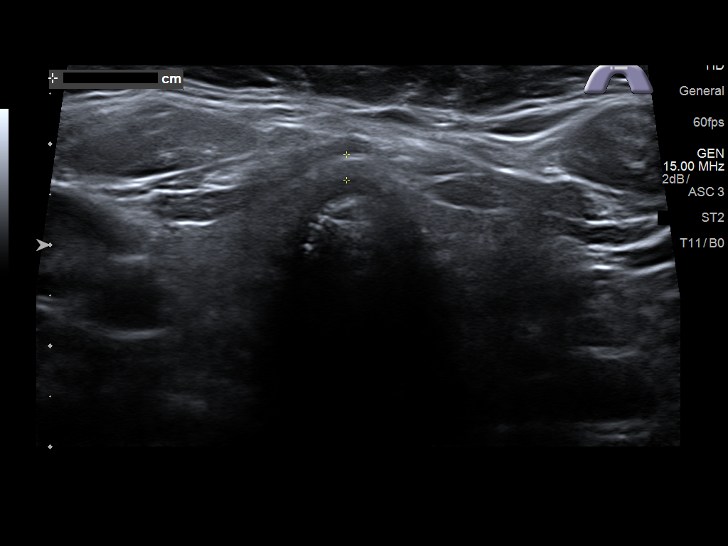
[im 8/43]
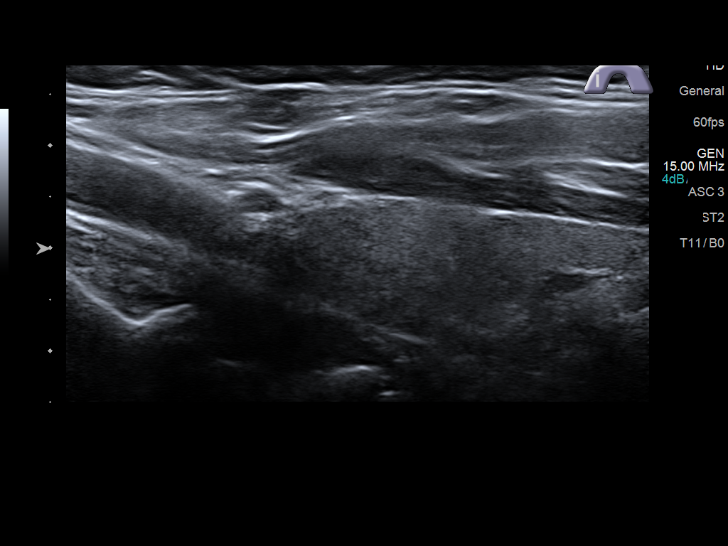
[im 11/43]
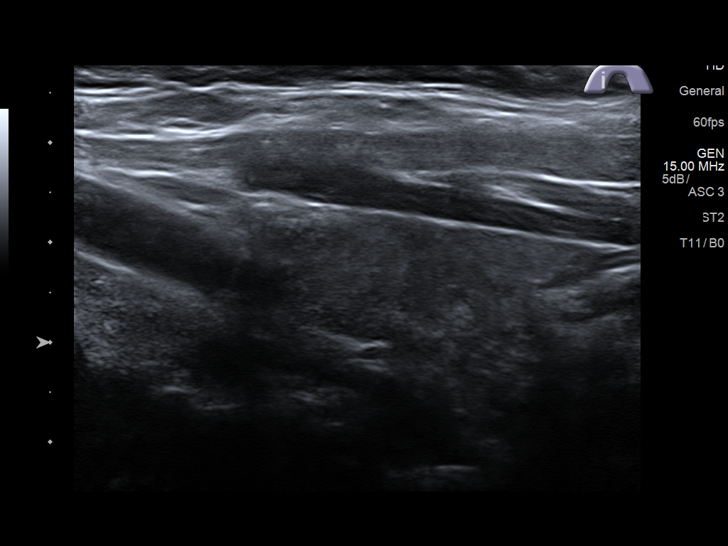
[im 15/43]
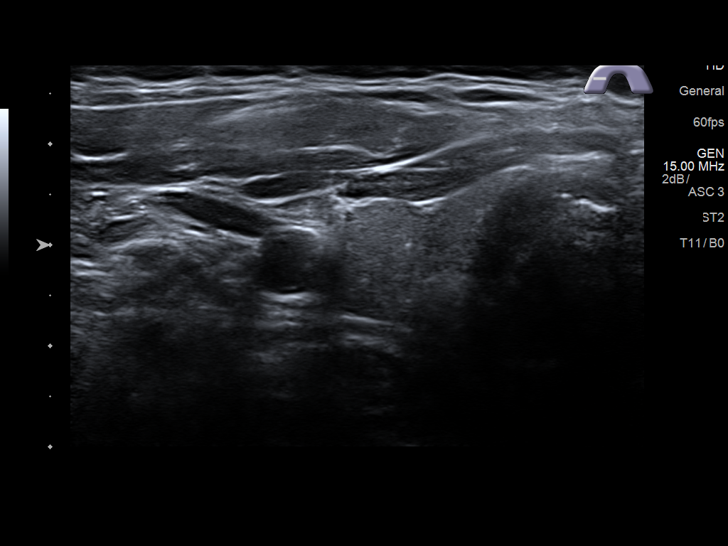
[im 16/43]
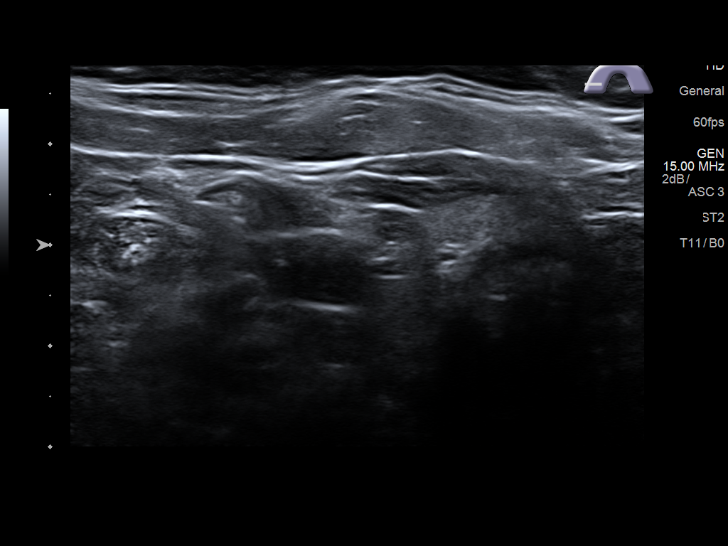
[im 20/43]
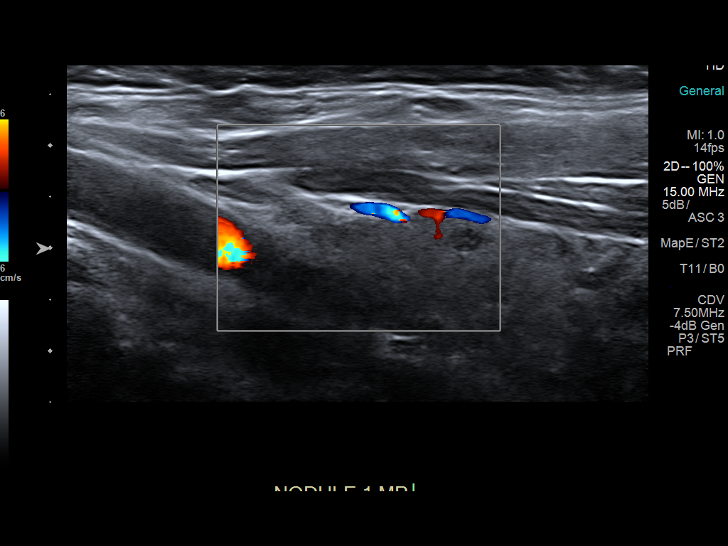
[im 23/43]
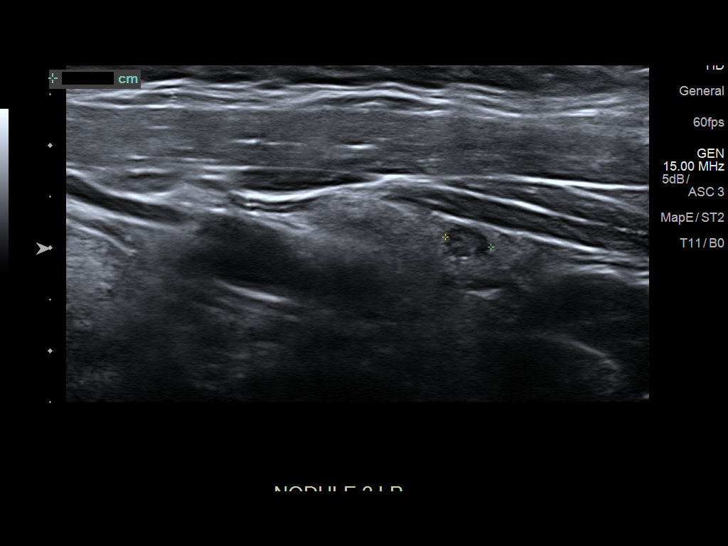
[im 27/43]
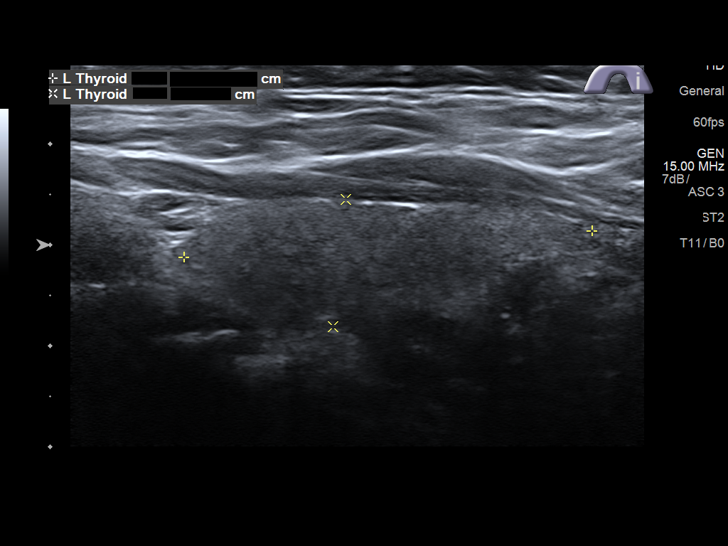
[im 29/43]
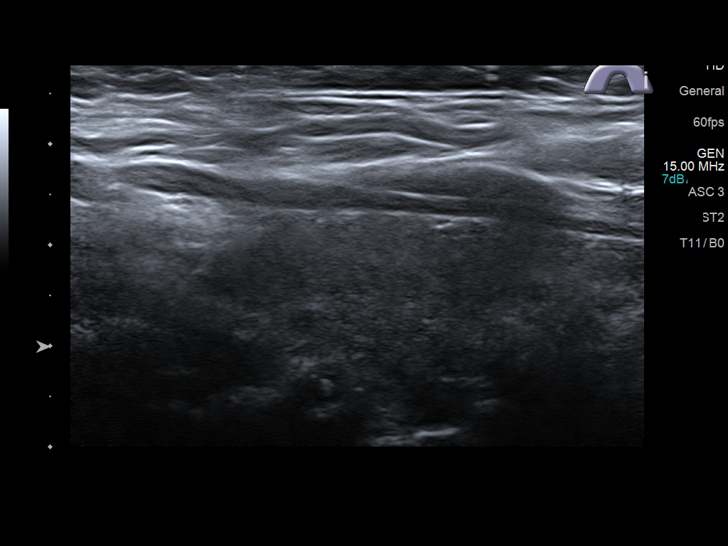
[im 32/43]
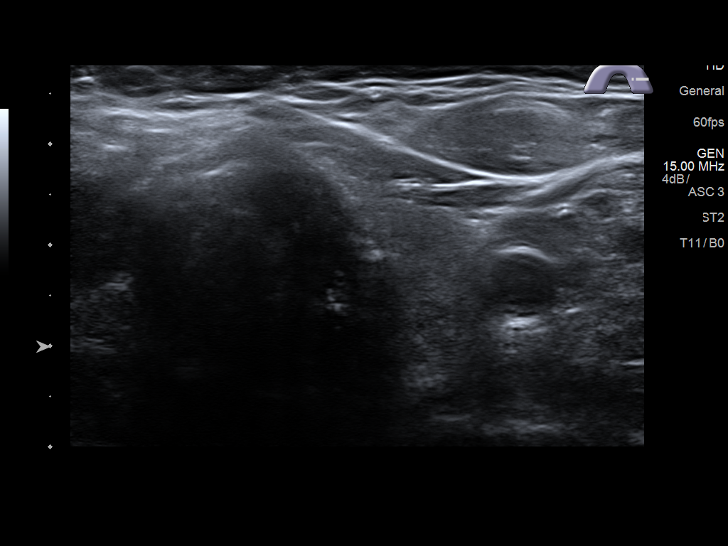
[im 36/43]
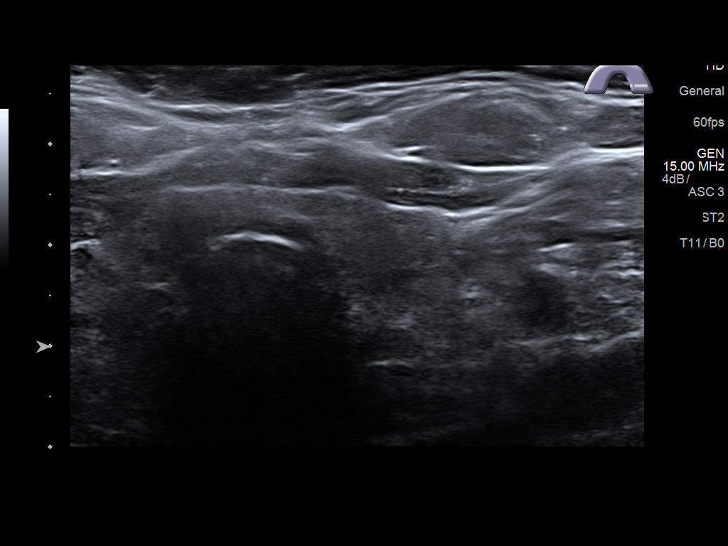
[im 39/43]
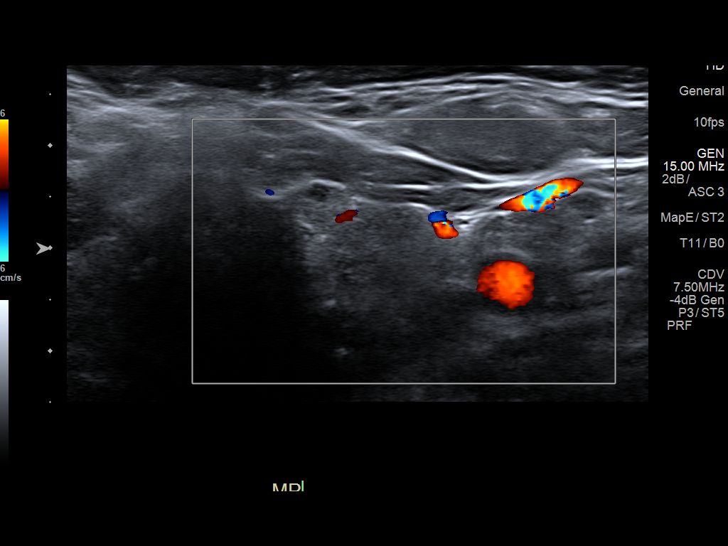
[im 43/43]
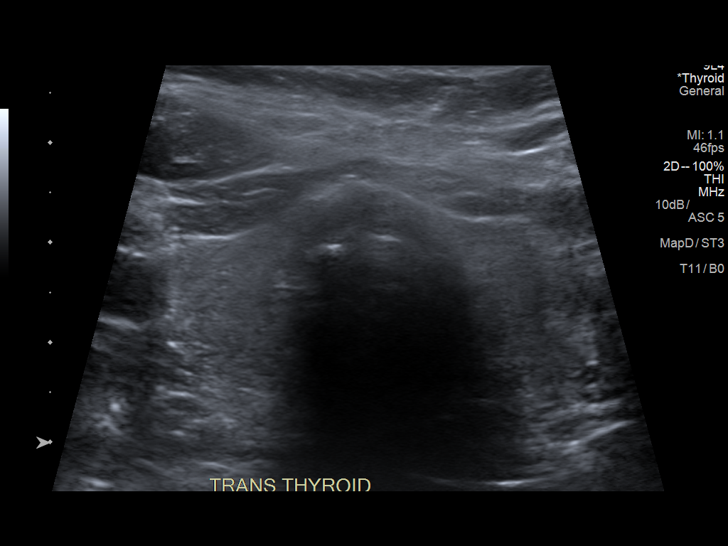

[14 of 25 positions shown; findings below may reference images not displayed]

FINDINGS: Parenchymal Echotexture: Moderately heterogenous

Isthmus: 0.3 cm thickness

Right lobe: 3.5 x 1.2 x 1.7 cm

Left lobe: 4 x 1.1 x 1.3 cm

_________________________________________________________

Estimated total number of nodules >/= 1 cm: 0

Number of spongiform nodules >/=  2 cm not described below (TR1): 0

Number of mixed cystic and solid nodules >/= 1.5 cm not described
below (TR2): 0

_________________________________________________________

A few scattered hypoechoic/cystic nodules without calcifications
bilaterally, all less than 5 mm.
IMPRESSION: 1. Normal-sized thyroid with small bilateral nodules. None meets
criteria for biopsy or dedicated imaging follow-up.

The above is in keeping with the ACR TI-RADS recommendations - [HOSPITAL] 5295;[DATE].

## 2018-04-22 DIAGNOSIS — J301 Allergic rhinitis due to pollen: Secondary | ICD-10-CM | POA: Diagnosis not present

## 2018-04-27 DIAGNOSIS — J301 Allergic rhinitis due to pollen: Secondary | ICD-10-CM | POA: Diagnosis not present

## 2018-06-10 DIAGNOSIS — E119 Type 2 diabetes mellitus without complications: Secondary | ICD-10-CM | POA: Diagnosis not present

## 2018-06-10 LAB — HM DIABETES EYE EXAM

## 2018-06-26 ENCOUNTER — Encounter: Payer: Self-pay | Admitting: Internal Medicine

## 2018-07-08 DIAGNOSIS — J301 Allergic rhinitis due to pollen: Secondary | ICD-10-CM | POA: Diagnosis not present

## 2018-07-10 ENCOUNTER — Other Ambulatory Visit: Payer: Self-pay | Admitting: Internal Medicine

## 2018-07-10 DIAGNOSIS — N393 Stress incontinence (female) (male): Secondary | ICD-10-CM

## 2018-07-26 ENCOUNTER — Other Ambulatory Visit: Payer: Self-pay | Admitting: Internal Medicine

## 2018-07-28 DIAGNOSIS — J301 Allergic rhinitis due to pollen: Secondary | ICD-10-CM | POA: Diagnosis not present

## 2018-08-25 ENCOUNTER — Ambulatory Visit: Payer: Medicare Other | Admitting: Internal Medicine

## 2018-09-09 ENCOUNTER — Other Ambulatory Visit: Payer: Self-pay | Admitting: Internal Medicine

## 2018-09-28 ENCOUNTER — Encounter: Payer: Self-pay | Admitting: Internal Medicine

## 2018-09-28 ENCOUNTER — Other Ambulatory Visit: Payer: Self-pay

## 2018-09-28 ENCOUNTER — Ambulatory Visit (INDEPENDENT_AMBULATORY_CARE_PROVIDER_SITE_OTHER): Payer: Medicare Other | Admitting: Internal Medicine

## 2018-09-28 VITALS — BP 128/64 | HR 66 | Ht 63.0 in | Wt 187.0 lb

## 2018-09-28 DIAGNOSIS — K449 Diaphragmatic hernia without obstruction or gangrene: Secondary | ICD-10-CM | POA: Diagnosis not present

## 2018-09-28 DIAGNOSIS — K219 Gastro-esophageal reflux disease without esophagitis: Secondary | ICD-10-CM | POA: Diagnosis not present

## 2018-09-28 DIAGNOSIS — E1169 Type 2 diabetes mellitus with other specified complication: Secondary | ICD-10-CM | POA: Diagnosis not present

## 2018-09-28 DIAGNOSIS — Z23 Encounter for immunization: Secondary | ICD-10-CM | POA: Diagnosis not present

## 2018-09-28 DIAGNOSIS — Z516 Encounter for desensitization to allergens: Secondary | ICD-10-CM | POA: Diagnosis not present

## 2018-09-28 DIAGNOSIS — I10 Essential (primary) hypertension: Secondary | ICD-10-CM | POA: Diagnosis not present

## 2018-09-28 DIAGNOSIS — D126 Benign neoplasm of colon, unspecified: Secondary | ICD-10-CM

## 2018-09-28 DIAGNOSIS — E785 Hyperlipidemia, unspecified: Secondary | ICD-10-CM

## 2018-09-28 DIAGNOSIS — E118 Type 2 diabetes mellitus with unspecified complications: Secondary | ICD-10-CM | POA: Diagnosis not present

## 2018-09-28 NOTE — Patient Instructions (Signed)
Pneumococcal Conjugate Vaccine (PCV13): What You Need to Know  1. Why get vaccinated?  Vaccination can protect both children and adults from pneumococcal disease.  Pneumococcal disease is caused by bacteria that can spread from person to person through close contact. It can cause ear infections, and it can also lead to more serious infections of the:  · Lungs (pneumonia),  · Blood (bacteremia), and  · Covering of the brain and spinal cord (meningitis).  Pneumococcal pneumonia is most common among adults. Pneumococcal meningitis can cause deafness and brain damage, and it kills about 1 child in 10 who get it.  Anyone can get pneumococcal disease, but children under 2 years of age and adults 65 years and older, people with certain medical conditions, and cigarette smokers are at the highest risk.  Before there was a vaccine, the [place] saw:  · more than 700 cases of meningitis,  · about 13,000 blood infections,  · about 5 million ear infections, and  · about 200 deaths  in children under 5 each year from pneumococcal disease. Since vaccine became available, severe pneumococcal disease in these children has fallen by 88%.  About 18,000 older adults die of pneumococcal disease each year in the [place].  Treatment of pneumococcal infections with penicillin and other drugs is not as effective as it used to be, because some strains of the disease have become resistant to these drugs. This makes prevention of the disease, through vaccination, even more important.  2. PCV13 vaccine  Pneumococcal conjugate vaccine (called PCV13) protects against 13 types of pneumococcal bacteria.  PCV13 is routinely given to children at 2, 4, 6, and 12-15 months of age. It is also recommended for children and adults 2 to 64 years of age with certain health conditions, and for all adults 65 years of age and older. Your doctor can give you details.  3. Some people should not get this vaccine  Anyone who has ever had a  life-threatening allergic reaction to a dose of this vaccine, to an earlier pneumococcal vaccine called PCV7, or to any vaccine containing diphtheria toxoid (for example, DTaP), should not get PCV13.  Anyone with a severe allergy to any component of PCV13 should not get the vaccine. Tell your doctor if the person being vaccinated has any severe allergies.  If the person scheduled for vaccination is not feeling well, your healthcare provider might decide to reschedule the shot on another day.  4. Risks of a vaccine reaction  With any medicine, including vaccines, there is a chance of reactions. These are usually mild and go away on their own, but serious reactions are also possible.  Problems reported following PCV13 varied by age and dose in the series. The most common problems reported among children were:  · About half became drowsy after the shot, had a temporary loss of appetite, or had redness or tenderness where the shot was given.  · About 1 out of 3 had swelling where the shot was given.  · About 1 out of 3 had a mild fever, and about 1 in 20 had a fever over 102.2°F.  · Up to about 8 out of 10 became fussy or irritable.  Adults have reported pain, redness, and swelling where the shot was given; also mild fever, fatigue, headache, chills, or muscle pain.  Young children who get PCV13 along with inactivated flu vaccine at the same time may be at increased risk for seizures caused by fever. Ask your doctor for more   information.  Problems that could happen after any vaccine:  · People sometimes faint after a medical procedure, including vaccination. Sitting or lying down for about 15 minutes can help prevent fainting, and injuries caused by a fall. Tell your doctor if you feel dizzy, or have vision changes or ringing in the ears.  · Some older children and adults get severe pain in the shoulder and have difficulty moving the arm where a shot was given. This happens very rarely.  · Any medication can cause a  severe allergic reaction. Such reactions from a vaccine are very rare, estimated at about 1 in a million doses, and would happen within a few minutes to a few hours after the vaccination.  As with any medicine, there is a very small chance of a vaccine causing a serious injury or death.  The safety of vaccines is always being monitored. For more information, visit: www.cdc.gov/vaccinesafety/  5. What if there is a serious reaction?  What should I look for?  · Look for anything that concerns you, such as signs of a severe allergic reaction, very high fever, or unusual behavior.  Signs of a severe allergic reaction can include hives, swelling of the face and throat, difficulty breathing, a fast heartbeat, dizziness, and weakness--usually within a few minutes to a few hours after the vaccination.  What should I do?  · If you think it is a severe allergic reaction or other emergency that can't wait, call 9-1-1 or get the person to the nearest hospital. Otherwise, call your doctor.  Reactions should be reported to the Vaccine Adverse Event Reporting System (VAERS). Your doctor should file this report, or you can do it yourself through the VAERS web site at www.vaers.hhs.gov, or by calling 1-800-822-7967.  VAERS does not give medical advice.  6. The National Vaccine Injury Compensation Program  The National Vaccine Injury Compensation Program (VICP) is a federal program that was created to compensate people who may have been injured by certain vaccines.  Persons who believe they may have been injured by a vaccine can learn about the program and about filing a claim by calling 1-800-338-2382 or visiting the VICP website at www.hrsa.gov/vaccinecompensation. There is a time limit to file a claim for compensation.  7. How can I learn more?  · Ask your healthcare provider. He or she can give you the vaccine package insert or suggest other sources of information.  · Call your local or state health department.  · Contact the  Centers for Disease Control and Prevention (CDC):  ? Call 1-800-232-4636 (1-800-CDC-INFO) or  ? Visit CDC's website at www.cdc.gov/vaccines  Vaccine Information Statement PCV13 Vaccine (04/07/2014)  This information is not intended to replace advice given to you by your health care provider. Make sure you discuss any questions you have with your health care provider.  Document Released: 03/17/2006 Document Revised: 12/30/2017 Document Reviewed: 12/30/2017  Elsevier Interactive Patient Education © 2019 Elsevier Inc.

## 2018-09-28 NOTE — Progress Notes (Signed)
Date:  09/28/2018   Name:  Pamela Rosario   DOB:  September 14, 1950   MRN:  [id number]   Chief Complaint: No chief complaint on file.  Hypertension  This is a chronic problem. The problem is controlled. Pertinent negatives include no chest pain, headaches, palpitations or shortness of breath. Past treatments include ACE inhibitors. The current treatment provides significant improvement. There are no compliance problems.   Diabetes  She presents for her follow-up diabetic visit. She has type 2 diabetes mellitus. Her disease course has been stable. Pertinent negatives for hypoglycemia include no headaches or tremors. Pertinent negatives for diabetes include no chest pain, no fatigue, no polydipsia and no polyuria. Current diabetic treatment includes oral agent (monotherapy). She is compliant with treatment all of the time. Her weight is stable. She monitors blood glucose at home 3-4 x per week. Blood glucose monitoring compliance is excellent. There is no change in her home blood glucose trend. Her breakfast blood glucose is taken between 6-7 am. Her breakfast blood glucose range is generally 110-130 mg/dl. An ACE inhibitor/angiotensin II receptor blocker is being taken. Eye exam is current.  Hyperlipidemia  Recent lipid tests were reviewed and are normal. Exacerbating diseases include diabetes. Pertinent negatives include no chest pain or shortness of breath. Current antihyperlipidemic treatment includes statins. The current treatment provides significant improvement of lipids.  Gastroesophageal Reflux  She complains of heartburn. She reports no abdominal pain, no chest pain or no coughing. This is a recurrent problem. The problem occurs occasionally. The heartburn is of mild intensity. Pertinent negatives include no fatigue. She has tried a PPI for the symptoms. The treatment provided significant relief.  Allergies - she is on allergy injections and doing well.  She tolerated the recent pollen.  She plans  to start weaning off of Xyzal and Flonase if possible.  Lab Results  Component Value Date   HGBA1C 6.7 (H) 02/26/2018   Lab Results  Component Value Date   CREATININE 0.90 02/26/2018   BUN 22 02/26/2018   NA 142 02/26/2018   K 4.8 02/26/2018   CL 104 02/26/2018   CO2 21 02/26/2018   Lab Results  Component Value Date   CHOL 143 02/26/2018   HDL 35 (L) 02/26/2018   LDLCALC 62 02/26/2018   TRIG 229 (H) 02/26/2018   CHOLHDL 4.1 02/26/2018     Review of Systems  Constitutional: Negative for appetite change, fatigue, fever and unexpected weight change.  HENT: Negative for tinnitus and trouble swallowing.   Eyes: Negative for visual disturbance.  Respiratory: Negative for cough, chest tightness and shortness of breath.   Cardiovascular: Negative for chest pain, palpitations and leg swelling.  Gastrointestinal: Positive for heartburn. Negative for abdominal pain.  Endocrine: Negative for polydipsia and polyuria.  Genitourinary: Negative for dysuria and hematuria.  Musculoskeletal: Negative for arthralgias.  Neurological: Negative for tremors, numbness and headaches.       Mild tingling in feet - mostly at night  Psychiatric/Behavioral: Negative for dysphoric mood.    Patient Active Problem List   Diagnosis Date Noted  . Stress incontinence of urine 02/25/2017  . Allergy desensitization therapy 01/20/2017  . Hiatal hernia with GERD without esophagitis 09/06/2016  . Fibrocystic breast changes of both breasts 09/06/2016  . Intraductal papilloma of breast, left 09/06/2016  . Hyperlipidemia associated with type 2 diabetes mellitus ([place]) 09/06/2016  . Essential hypertension 09/06/2016  . Type II diabetes mellitus with complication ([place]) 85/07/7739  . Thyroid nodule 09/06/2016  . Adenomatous colon polyp  09/06/2016    Allergies  Allergen Reactions  . Demerol [Meperidine] Hives  . Tetracyclines & Related Hives  . Simvastatin Other (See Comments)    Muscle pain    Past  Surgical History:  Procedure Laterality Date  . brachial cleft cyst excison ( Right Neck )  1971  . BREAST EXCISIONAL BIOPSY Left    intraductal papilloma  . [place]   emergency  . COLONOSCOPY  08/24/2013   tubular adenoma  . ESOPHAGOGASTRODUODENOSCOPY  2005   hiatal hernia & polyp found  . EXCISION / BIOPSY BREAST / NIPPLE / DUCT Left 2005  . LARYNGOSCOPY  2006  . left intraductal papilloma excision of left breast  2007  . TONSILECTOMY, ADENOIDECTOMY, BILATERAL MYRINGOTOMY AND TUBES      Social History   Tobacco Use  . Smoking status: Never Smoker  . Smokeless tobacco: Never Used  . Tobacco comment: smoking cessation materials not required  Substance Use Topics  . Alcohol use: No  . Drug use: No     Medication list has been reviewed and updated.  Current Meds  Medication Sig  . atorvastatin (LIPITOR) 20 MG tablet TAKE 1 TABLET (20 MG TOTAL) BY MOUTH DAILY.  . fluticasone (FLONASE) 50 MCG/ACT nasal spray Place 2 sprays into both nostrils daily.  Marland Kitchen glucose blood test strip 1 each by Other route as needed for other. Use as instructed  . levocetirizine (XYZAL) 5 MG tablet Take 5 mg by mouth every evening.  Marland Kitchen lisinopril (PRINIVIL,ZESTRIL) 10 MG tablet TAKE 1 TABLET DAILY  . metFORMIN (GLUCOPHAGE) 500 MG tablet Take 0.5 tablets (250 mg total) by mouth daily.  Marland Kitchen omeprazole (PRILOSEC) 20 MG capsule Take 1 capsule (20 mg total) by mouth 2 (two) times daily before a meal.  . tolterodine (DETROL) 2 MG tablet TAKE 1 TABLET (2 MG TOTAL) BY MOUTH 2 (TWO) TIMES DAILY.  Marland Kitchen UNABLE TO FIND Allergy Shots- [facility].  Started- 01/2017    PHQ 2/9 Scores 09/28/2018 02/25/2018 01/27/2017 11/13/2016  PHQ - 2 Score 0 0 0 0  PHQ- 9 Score - 0 - -    BP Readings from Last 3 Encounters:  09/28/18 128/64  03/18/18 132/78  02/26/18 128/76    Physical Exam Vitals signs and nursing note reviewed.  Constitutional:      General: She is not in acute distress.     Appearance: She is well-developed.  HENT:     Head: Normocephalic and atraumatic.  Neck:     Musculoskeletal: Normal range of motion.     Vascular: No carotid bruit.  Cardiovascular:     Rate and Rhythm: Normal rate and regular rhythm.  No extrasystoles are present.    Pulses: Normal pulses.          Dorsalis pedis pulses are 2+ on the right side and 2+ on the left side.       Posterior tibial pulses are 2+ on the right side and 2+ on the left side.     Heart sounds: Normal heart sounds.  Pulmonary:     Effort: Pulmonary effort is normal. No respiratory distress.  Musculoskeletal: Normal range of motion.     Right lower leg: No edema.     Left lower leg: No edema.  Lymphadenopathy:     Cervical: No cervical adenopathy.  Skin:    General: Skin is warm and dry.     Findings: No rash.  Neurological:     Mental Status: She is alert  and oriented to person, place, and time.  Psychiatric:        Behavior: Behavior normal.        Thought Content: Thought content normal.     Wt Readings from Last 3 Encounters:  09/28/18 187 lb (84.8 kg)  03/18/18 184 lb (83.5 kg)  02/26/18 183 lb (83 kg)    BP 128/64   Pulse 66   Ht 5\' 3"  (1.6 m)   Wt 187 lb (84.8 kg)   SpO2 96%   BMI 33.13 kg/m   Assessment and Plan: 1. Type II diabetes mellitus with complication (HCC) Continue current therapy, diet and exercise - Hemoglobin R7N - Basic metabolic panel  2. Essential hypertension controlled  3. Hyperlipidemia associated with type 2 diabetes mellitus ([place]) On statin therapy  4. Need for vaccination for pneumococcus Given today PPV-23 in one year - Pneumococcal conjugate vaccine 13-valent IM  5. Adenomatous polyp of colon, unspecified part of colon Last done in [place] in 2015 - Ambulatory referral to Gastroenterology  6. Hiatal hernia with GERD without esophagitis Continue omepazole   Partially dictated using Editor, commissioning. Any errors are unintentional.  Halina Maidens, MD  [facility]  09/28/2018

## 2018-09-29 ENCOUNTER — Other Ambulatory Visit: Payer: Self-pay

## 2018-09-29 ENCOUNTER — Telehealth: Payer: Self-pay

## 2018-09-29 DIAGNOSIS — D126 Benign neoplasm of colon, unspecified: Secondary | ICD-10-CM

## 2018-09-29 LAB — BASIC METABOLIC PANEL
BUN/Creatinine Ratio: 25 (ref 12–28)
BUN: 20 mg/dL (ref 8–27)
CO2: 24 mmol/L (ref 20–29)
Calcium: 9.8 mg/dL (ref 8.7–10.3)
Chloride: 103 mmol/L (ref 96–106)
Creatinine, Ser: 0.79 mg/dL (ref 0.57–1.00)
GFR calc Af Amer: 90 mL/min/{1.73_m2} (ref >59)
GFR calc non Af Amer: 78 mL/min/{1.73_m2} (ref >59)
Glucose: 137 mg/dL — ABNORMAL HIGH (ref 65–99)
Potassium: 4.6 mmol/L (ref 3.5–5.2)
Sodium: 141 mmol/L (ref 134–144)

## 2018-09-29 LAB — HEMOGLOBIN A1C
Est. average glucose Bld gHb Est-mCnc: 143 mg/dL
Hgb A1c MFr Bld: 6.6 % — ABNORMAL HIGH (ref 4.8–5.6)

## 2018-09-29 NOTE — Telephone Encounter (Signed)
Gastroenterology Pre-Procedure Review  Request Date: 11/26/18 Requesting Physician: Dr. Allen Norris  PATIENT REVIEW QUESTIONS: The patient responded to the following health history questions as indicated:    1. Are you having any GI issues? No 2. Do you have a personal history of Polyps? Yes 5 years ago adenomatous polyp 3. Do you have a family history of Colon Cancer or Polyps? No 4. Diabetes Mellitus?Yes takes oral medications 5. Joint replacements in the past 12 months?No 6. Major health problems in the past 3 months?No 7. Any artificial heart valves, MVP, or defibrillator?No    MEDICATIONS & ALLERGIES:    Patient reports the following regarding taking any anticoagulation/antiplatelet therapy:   Plavix, Coumadin, Eliquis, Xarelto, Lovenox, Pradaxa, Brilinta, or Effient? No Aspirin? No  Patient confirms/reports the following medications:  Current Outpatient Medications  Medication Sig Dispense Refill  . atorvastatin (LIPITOR) 20 MG tablet TAKE 1 TABLET (20 MG TOTAL) BY MOUTH DAILY. 90 tablet 1  . fluticasone (FLONASE) 50 MCG/ACT nasal spray Place 2 sprays into both nostrils daily.    Marland Kitchen glucose blood test strip 1 each by Other route as needed for other. Use as instructed    . levocetirizine (XYZAL) 5 MG tablet Take 5 mg by mouth every evening.    Marland Kitchen lisinopril (PRINIVIL,ZESTRIL) 10 MG tablet TAKE 1 TABLET DAILY 90 tablet 1  . metFORMIN (GLUCOPHAGE) 500 MG tablet Take 0.5 tablets (250 mg total) by mouth daily. 45 tablet 3  . omeprazole (PRILOSEC) 20 MG capsule Take 1 capsule (20 mg total) by mouth 2 (two) times daily before a meal. 180 capsule 3  . tolterodine (DETROL) 2 MG tablet TAKE 1 TABLET (2 MG TOTAL) BY MOUTH 2 (TWO) TIMES DAILY. 180 tablet 1  . UNABLE TO FIND Allergy Shots- [facility].  Started- 01/2017     No current facility-administered medications for this visit.     Patient confirms/reports the following allergies:  Allergies  Allergen Reactions  . Demerol  [Meperidine] Hives  . Tetracyclines & Related Hives  . Simvastatin Other (See Comments)    Muscle pain    No orders of the defined types were placed in this encounter.   AUTHORIZATION INFORMATION Primary Insurance: 1D#: Group #:  Secondary Insurance: 1D#: Group #:  SCHEDULE INFORMATION: Date: 11/26/18 Time: Location:[facility]

## 2018-10-02 DIAGNOSIS — J301 Allergic rhinitis due to pollen: Secondary | ICD-10-CM | POA: Diagnosis not present

## 2018-10-20 ENCOUNTER — Telehealth: Payer: Self-pay | Admitting: Gastroenterology

## 2018-10-20 NOTE — Telephone Encounter (Signed)
Patient called & would like to r/s her colonoscopy scheduled for 11-26-2018 with Dr Allen Norris in [place]. She would like to reschedule to sometime the 1st wk of September.

## 2018-10-21 NOTE — Telephone Encounter (Signed)
Returned patients call.  She has been rescheduled to Sept 4th at [facility] with Dr.Wohl.  Maudie Mercury at [facility] has been informed.  Referral has been updated.  Thanks [employer]

## 2018-12-22 DIAGNOSIS — J301 Allergic rhinitis due to pollen: Secondary | ICD-10-CM | POA: Diagnosis not present

## 2018-12-23 DIAGNOSIS — J301 Allergic rhinitis due to pollen: Secondary | ICD-10-CM | POA: Diagnosis not present

## 2018-12-27 ENCOUNTER — Other Ambulatory Visit: Payer: Self-pay | Admitting: Internal Medicine

## 2018-12-27 DIAGNOSIS — N393 Stress incontinence (female) (male): Secondary | ICD-10-CM

## 2019-01-19 ENCOUNTER — Other Ambulatory Visit: Payer: Self-pay | Admitting: Internal Medicine

## 2019-01-19 DIAGNOSIS — Z1231 Encounter for screening mammogram for malignant neoplasm of breast: Secondary | ICD-10-CM

## 2019-01-21 ENCOUNTER — Other Ambulatory Visit: Payer: Self-pay | Admitting: Internal Medicine

## 2019-01-22 ENCOUNTER — Other Ambulatory Visit: Payer: Self-pay

## 2019-01-22 MED ORDER — LISINOPRIL 10 MG PO TABS: 10.0000 mg | ORAL_TABLET | Freq: Every day | ORAL | 1 refills | Status: DC

## 2019-02-01 ENCOUNTER — Encounter: Payer: Self-pay | Admitting: *Deleted

## 2019-02-01 ENCOUNTER — Other Ambulatory Visit: Payer: Self-pay

## 2019-02-02 ENCOUNTER — Other Ambulatory Visit
Admission: RE | Admit: 2019-02-02 | Discharge: 2019-02-02 | Disposition: A | Discharge status: Home / Self Care | Payer: Medicare Other | Source: Ambulatory Visit | Attending: Gastroenterology | Admitting: Gastroenterology

## 2019-02-02 DIAGNOSIS — Z20828 Contact with and (suspected) exposure to other viral communicable diseases: Secondary | ICD-10-CM | POA: Insufficient documentation

## 2019-02-02 DIAGNOSIS — Z01812 Encounter for preprocedural laboratory examination: Secondary | ICD-10-CM | POA: Diagnosis not present

## 2019-02-02 LAB — SARS CORONAVIRUS 2 (TAT 6-24 HRS): SARS Coronavirus 2: NEGATIVE
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
## 2019-02-04 NOTE — Discharge Instructions (Signed)

## 2019-02-05 ENCOUNTER — Ambulatory Visit: Payer: Medicare Other | Admitting: Anesthesiology

## 2019-02-05 ENCOUNTER — Ambulatory Visit
Admission: RE | Admit: 2019-02-05 | Discharge: 2019-02-05 | Disposition: A | Discharge status: Home / Self Care | Payer: Medicare Other | Attending: Gastroenterology | Admitting: Gastroenterology

## 2019-02-05 ENCOUNTER — Encounter
Admission: RE | Disposition: A | Discharge status: Home / Self Care | Payer: Self-pay | Source: Home / Self Care | Attending: Gastroenterology

## 2019-02-05 ENCOUNTER — Other Ambulatory Visit: Payer: Self-pay

## 2019-02-05 DIAGNOSIS — K5730 Diverticulosis of large intestine without perforation or abscess without bleeding: Secondary | ICD-10-CM | POA: Insufficient documentation

## 2019-02-05 DIAGNOSIS — D123 Benign neoplasm of transverse colon: Secondary | ICD-10-CM | POA: Diagnosis not present

## 2019-02-05 DIAGNOSIS — Z79899 Other long term (current) drug therapy: Secondary | ICD-10-CM | POA: Diagnosis not present

## 2019-02-05 DIAGNOSIS — E785 Hyperlipidemia, unspecified: Secondary | ICD-10-CM | POA: Insufficient documentation

## 2019-02-05 DIAGNOSIS — D125 Benign neoplasm of sigmoid colon: Secondary | ICD-10-CM

## 2019-02-05 DIAGNOSIS — E119 Type 2 diabetes mellitus without complications: Secondary | ICD-10-CM | POA: Diagnosis not present

## 2019-02-05 DIAGNOSIS — D122 Benign neoplasm of ascending colon: Secondary | ICD-10-CM | POA: Diagnosis not present

## 2019-02-05 DIAGNOSIS — K219 Gastro-esophageal reflux disease without esophagitis: Secondary | ICD-10-CM | POA: Insufficient documentation

## 2019-02-05 DIAGNOSIS — Z860100 Personal history of colon polyps, unspecified: Secondary | ICD-10-CM

## 2019-02-05 DIAGNOSIS — I10 Essential (primary) hypertension: Secondary | ICD-10-CM | POA: Diagnosis not present

## 2019-02-05 DIAGNOSIS — K641 Second degree hemorrhoids: Secondary | ICD-10-CM | POA: Insufficient documentation

## 2019-02-05 DIAGNOSIS — K635 Polyp of colon: Secondary | ICD-10-CM | POA: Diagnosis not present

## 2019-02-05 DIAGNOSIS — Z7984 Long term (current) use of oral hypoglycemic drugs: Secondary | ICD-10-CM | POA: Insufficient documentation

## 2019-02-05 DIAGNOSIS — D126 Benign neoplasm of colon, unspecified: Secondary | ICD-10-CM

## 2019-02-05 DIAGNOSIS — Z1211 Encounter for screening for malignant neoplasm of colon: Secondary | ICD-10-CM | POA: Diagnosis not present

## 2019-02-05 HISTORY — PX: COLONOSCOPY WITH PROPOFOL: SHX5780

## 2019-02-05 HISTORY — DX: Personal history of other diseases of the digestive system: Z87.19

## 2019-02-05 HISTORY — DX: Other complications of anesthesia, initial encounter: T88.59XA

## 2019-02-05 HISTORY — PX: POLYPECTOMY: SHX5525

## 2019-02-05 LAB — GLUCOSE, CAPILLARY
Glucose-Capillary: 133 mg/dL — ABNORMAL HIGH (ref 70–99)
Glucose-Capillary: 123 mg/dL — ABNORMAL HIGH (ref 70–99)

## 2019-02-05 SURGERY — COLONOSCOPY WITH PROPOFOL
Anesthesia: General | Site: Rectum

## 2019-02-05 MED ORDER — PROPOFOL 10 MG/ML IV BOLUS
INTRAVENOUS | Status: DC | PRN
  Administered 2019-02-05: 30 mg via INTRAVENOUS
  Administered 2019-02-05 (×2): 20 mg via INTRAVENOUS
  Administered 2019-02-05: 10 mg via INTRAVENOUS
  Administered 2019-02-05 (×4): 20 mg via INTRAVENOUS
  Administered 2019-02-05: 70 mg via INTRAVENOUS
  Administered 2019-02-05: 20 mg via INTRAVENOUS

## 2019-02-05 MED ORDER — LACTATED RINGERS IV SOLN
100.0000 mL/h | INTRAVENOUS | Status: DC
  Administered 2019-02-05: 100 mL/h via INTRAVENOUS

## 2019-02-05 MED ORDER — ACETAMINOPHEN 10 MG/ML IV SOLN: 1000.0000 mg | Freq: Once | INTRAVENOUS | Status: DC | PRN

## 2019-02-05 MED ORDER — STERILE WATER FOR IRRIGATION IR SOLN
Status: DC | PRN
  Administered 2019-02-05: 08:00:00 30 mL

## 2019-02-05 MED ORDER — ONDANSETRON HCL 4 MG/2ML IJ SOLN: 4.0000 mg | Freq: Once | INTRAMUSCULAR | Status: DC | PRN

## 2019-02-05 MED ORDER — LIDOCAINE HCL (CARDIAC) PF 100 MG/5ML IV SOSY
PREFILLED_SYRINGE | INTRAVENOUS | Status: DC | PRN
  Administered 2019-02-05: 40 mg via INTRAVENOUS

## 2019-02-05 MED ORDER — SODIUM CHLORIDE 0.9 % IV SOLN: INTRAVENOUS | Status: DC

## 2019-02-05 SURGICAL SUPPLY — 6 items
CANISTER SUCT 1200ML W/VALVE (MISCELLANEOUS) ×3 IMPLANT
FORCEPS BIOP RAD 4 LRG CAP 4 (CUTTING FORCEPS) ×2 IMPLANT
GOWN CVR UNV OPN BCK APRN NK (MISCELLANEOUS) ×2 IMPLANT
GOWN ISOL THUMB LOOP REG UNIV (MISCELLANEOUS) ×4
KIT ENDO PROCEDURE OLY (KITS) ×3 IMPLANT
WATER STERILE IRR 250ML POUR (IV SOLUTION) ×3 IMPLANT

## 2019-02-05 NOTE — Op Note (Signed)
[facility] Gastroenterology Patient Name: Pamela Rosario Procedure Date: 02/05/2019 7:28 AM MRN: [id number] Account #: [id number] Date of Birth: [birth year] Admit Type: Outpatient Age: 68 Room: [facility] OR ROOM 01 Gender: Female Note Status: Finalized Procedure:            Colonoscopy Indications:          High risk colon cancer surveillance: Personal history                        of colonic polyps Providers:            Lucilla Lame MD, MD Referring MD:         Halina Maidens, MD (Referring MD) Medicines:            Propofol per Anesthesia Complications:        No immediate complications. Procedure:            Pre-Anesthesia Assessment:                       - Prior to the procedure, a History and Physical was                        performed, and patient medications and allergies were                        reviewed. The patient's tolerance of previous                        anesthesia was also reviewed. The risks and benefits of                        the procedure and the sedation options and risks were                        discussed with the patient. All questions were                        answered, and informed consent was obtained. Prior                        Anticoagulants: The patient has taken no previous                        anticoagulant or antiplatelet agents. ASA Grade                        Assessment: II - A patient with mild systemic disease.                        After reviewing the risks and benefits, the patient was                        deemed in satisfactory condition to undergo the                        procedure.                       After obtaining informed consent, the colonoscope was  passed under direct vision. Throughout the procedure,                        the patient's blood pressure, pulse, and oxygen                        saturations were monitored continuously. The was                        introduced  through the anus and advanced to the the                        cecum, identified by appendiceal orifice and ileocecal                        valve. The colonoscopy was performed without                        difficulty. The patient tolerated the procedure well.                        The quality of the bowel preparation was excellent. Findings:      The perianal and digital rectal examinations were normal.      A 3 mm polyp was found in the ascending colon. The polyp was sessile.       The polyp was removed with a cold biopsy forceps. Resection and       retrieval were complete.      A 4 mm polyp was found in the transverse colon. The polyp was sessile.       The polyp was removed with a cold biopsy forceps. Resection and       retrieval were complete.      Three sessile polyps were found in the sigmoid colon. The polyps were 3       to 4 mm in size. These polyps were removed with a cold biopsy forceps.       Resection and retrieval were complete.      Non-bleeding internal hemorrhoids were found during retroflexion. The       hemorrhoids were Grade II (internal hemorrhoids that prolapse but reduce       spontaneously).      Multiple small-mouthed diverticula were found in the entire colon. More       on the left then right. Impression:           - One 3 mm polyp in the ascending colon, removed with a                        cold biopsy forceps. Resected and retrieved.                       - One 4 mm polyp in the transverse colon, removed with                        a cold biopsy forceps. Resected and retrieved.                       - Three 3 to 4 mm polyps in the sigmoid colon, removed  with a cold biopsy forceps. Resected and retrieved.                       - Non-bleeding internal hemorrhoids.                       - Diverticulosis in the entire examined colon. Recommendation:       - Discharge patient to home.                       - Resume previous diet.                        - Continue present medications.                       - Await pathology results.                       - Repeat colonoscopy in 5 years for surveillance. Procedure Code(s):    --- Professional ---                       [phone], Colonoscopy, flexible; with biopsy, single or                        multiple Diagnosis Code(s):    --- Professional ---                       Z86.010, Personal history of colonic polyps                       K63.5, Polyp of colon CPT copyright 2019 American Medical Association. All rights reserved. The codes documented in this report are preliminary and upon coder review may  be revised to meet current compliance requirements. Lucilla Lame MD, MD 02/05/2019 8:13:26 AM This report has been signed electronically. Number of Addenda: 0 Note Initiated On: 02/05/2019 7:28 AM Scope Withdrawal Time: 0 hours 11 minutes 2 seconds  Total Procedure Duration: 0 hours 14 minutes 50 seconds  Estimated Blood Loss: Estimated blood loss: none.      [facility]

## 2019-02-05 NOTE — Anesthesia Postprocedure Evaluation (Signed)
Anesthesia Post Note  Patient: Pamela Rosario  Procedure(s) Performed: COLONOSCOPY WITH BIOPSIES (N/A Rectum) POLYPECTOMY (N/A Rectum)  Patient location during evaluation: PACU Anesthesia Type: General Level of consciousness: awake and alert Pain management: pain level controlled Vital Signs Assessment: post-procedure vital signs reviewed and stable Respiratory status: spontaneous breathing, nonlabored ventilation, respiratory function stable and patient connected to nasal cannula oxygen Cardiovascular status: blood pressure returned to baseline and stable Postop Assessment: no apparent nausea or vomiting Anesthetic complications: no    Jaysin Gayler A  Wayman Hoard

## 2019-02-05 NOTE — Transfer of Care (Signed)
Immediate Anesthesia Transfer of Care Note  Patient: Pamela Rosario  Procedure(s) Performed: COLONOSCOPY WITH BIOPSIES (N/A Rectum) POLYPECTOMY (N/A Rectum)  Patient Location: PACU  Anesthesia Type: General  Level of Consciousness: awake, alert  and patient cooperative  Airway and Oxygen Therapy: Patient Spontanous Breathing and Patient connected to supplemental oxygen  Post-op Assessment: Post-op Vital signs reviewed, Patient's Cardiovascular Status Stable, Respiratory Function Stable, Patent Airway and No signs of Nausea or vomiting  Post-op Vital Signs: Reviewed and stable  Complications: No apparent anesthesia complications
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
## 2019-02-05 NOTE — Anesthesia Procedure Notes (Signed)
Performed by: Kasheena Sambrano, CRNA Pre-anesthesia Checklist: Patient identified, Emergency Drugs available, Suction available, Timeout performed and Patient being monitored Patient Re-evaluated:Patient Re-evaluated prior to induction Oxygen Delivery Method: Nasal cannula Placement Confirmation: positive ETCO2       

## 2019-02-06 ENCOUNTER — Encounter: Payer: Self-pay | Admitting: Gastroenterology

## 2019-02-09 ENCOUNTER — Encounter: Payer: Self-pay | Admitting: Gastroenterology

## 2019-02-10 ENCOUNTER — Encounter: Payer: Self-pay | Admitting: Gastroenterology

## 2019-02-14 ENCOUNTER — Other Ambulatory Visit: Payer: Self-pay | Admitting: Internal Medicine

## 2019-02-14 DIAGNOSIS — E119 Type 2 diabetes mellitus without complications: Secondary | ICD-10-CM

## 2019-02-15 ENCOUNTER — Other Ambulatory Visit: Payer: Self-pay

## 2019-02-15 ENCOUNTER — Ambulatory Visit
Admission: RE | Admit: 2019-02-15 | Discharge: 2019-02-15 | Disposition: A | Discharge status: Home / Self Care | Payer: Medicare Other | Source: Ambulatory Visit | Attending: Internal Medicine | Admitting: Internal Medicine

## 2019-02-15 DIAGNOSIS — Z1231 Encounter for screening mammogram for malignant neoplasm of breast: Secondary | ICD-10-CM | POA: Insufficient documentation

## 2019-03-01 ENCOUNTER — Ambulatory Visit: Payer: 59 | Admitting: Internal Medicine

## 2019-03-01 ENCOUNTER — Ambulatory Visit (INDEPENDENT_AMBULATORY_CARE_PROVIDER_SITE_OTHER): Payer: Medicare Other

## 2019-03-01 ENCOUNTER — Other Ambulatory Visit: Payer: Self-pay

## 2019-03-01 VITALS — BP 112/72 | HR 87 | Temp 98.2°F | Resp 16 | Ht 62.0 in | Wt 182.2 lb

## 2019-03-01 DIAGNOSIS — Z0000 Encounter for general adult medical examination without abnormal findings: Secondary | ICD-10-CM | POA: Diagnosis not present

## 2019-03-01 DIAGNOSIS — Z780 Asymptomatic menopausal state: Secondary | ICD-10-CM

## 2019-03-01 NOTE — Patient Instructions (Signed)
Pamela Rosario , Thank you for taking time to come for your Medicare Wellness Visit. I appreciate your ongoing commitment to your health goals. Please review the following plan we discussed and let me know if I can assist you in the future.   Screening recommendations/referrals: Colonoscopy: done 02/05/19. Repeat in 2025 Mammogram: done 02/15/19 Bone Density: Please call [phone] to schedule your bone density screening.  Recommended yearly ophthalmology/optometry visit for glaucoma screening and checkup Recommended yearly dental visit for hygiene and checkup  Vaccinations: Influenza vaccine: due  Pneumococcal vaccine: done 09/28/18 Tdap vaccine: done 2014 Shingles vaccine: Shingrix series completed 2019    Advanced directives: Advance directive discussed with you today. I have provided a copy for you to complete at home and have notarized. Once this is complete please bring a copy in to our office so we can scan it into your chart.  Conditions/risks identified: Recommend increasing physical activity to at least 3 days per week  Next appointment: Please follow up in one year for your Medicare Annual Wellness visit.     Preventive Care 70 Years and Older, Female Preventive care refers to lifestyle choices and visits with your health care provider that can promote health and wellness. What does preventive care include?  A yearly physical exam. This is also called an annual well check.  Dental exams once or twice a year.  Routine eye exams. Ask your health care provider how often you should have your eyes checked.  Personal lifestyle choices, including:  Daily care of your teeth and gums.  Regular physical activity.  Eating a healthy diet.  Avoiding tobacco and drug use.  Limiting alcohol use.  Practicing safe sex.  Taking low-dose aspirin every day.  Taking vitamin and mineral supplements as recommended by your health care provider. What happens during an annual well  check? The services and screenings done by your health care provider during your annual well check will depend on your age, overall health, lifestyle risk factors, and family history of disease. Counseling  Your health care provider may ask you questions about your:  Alcohol use.  Tobacco use.  Drug use.  Emotional well-being.  Home and relationship well-being.  Sexual activity.  Eating habits.  History of falls.  Memory and ability to understand (cognition).  Work and work Statistician.  Reproductive health. Screening  You may have the following tests or measurements:  Height, weight, and BMI.  Blood pressure.  Lipid and cholesterol levels. These may be checked every 5 years, or more frequently if you are over 54 years old.  Skin check.  Lung cancer screening. You may have this screening every year starting at age 7 if you have a 30-pack-year history of smoking and currently smoke or have quit within the past 15 years.  Fecal occult blood test (FOBT) of the stool. You may have this test every year starting at age 37.  Flexible sigmoidoscopy or colonoscopy. You may have a sigmoidoscopy every 5 years or a colonoscopy every 10 years starting at age 55.  Hepatitis C blood test.  Hepatitis B blood test.  Sexually transmitted disease (STD) testing.  Diabetes screening. This is done by checking your blood sugar (glucose) after you have not eaten for a while (fasting). You may have this done every 1-3 years.  Bone density scan. This is done to screen for osteoporosis. You may have this done starting at age 72.  Mammogram. This may be done every 1-2 years. Talk to your health care provider about  how often you should have regular mammograms. Talk with your health care provider about your test results, treatment options, and if necessary, the need for more tests. Vaccines  Your health care provider may recommend certain vaccines, such as:  Influenza vaccine. This is  recommended every year.  Tetanus, diphtheria, and acellular pertussis (Tdap, Td) vaccine. You may need a Td booster every 10 years.  Zoster vaccine. You may need this after age 107.  Pneumococcal 13-valent conjugate (PCV13) vaccine. One dose is recommended after age 60.  Pneumococcal polysaccharide (PPSV23) vaccine. One dose is recommended after age 9. Talk to your health care provider about which screenings and vaccines you need and how often you need them. This information is not intended to replace advice given to you by your health care provider. Make sure you discuss any questions you have with your health care provider. Document Released: 06/16/2015 Document Revised: 02/07/2016 Document Reviewed: 03/21/2015 Elsevier Interactive Patient Education  2017 [place] Prevention in the Home Falls can cause injuries. They can happen to people of all ages. There are many things you can do to make your home safe and to help prevent falls. What can I do on the outside of my home?  Regularly fix the edges of walkways and driveways and fix any cracks.  Remove anything that might make you trip as you walk through a door, such as a raised step or threshold.  Trim any bushes or trees on the path to your home.  Use bright outdoor lighting.  Clear any walking paths of anything that might make someone trip, such as rocks or tools.  Regularly check to see if handrails are loose or broken. Make sure that both sides of any steps have handrails.  Any raised decks and porches should have guardrails on the edges.  Have any leaves, snow, or ice cleared regularly.  Use sand or salt on walking paths during winter.  Clean up any spills in your garage right away. This includes oil or grease spills. What can I do in the bathroom?  Use night lights.  Install grab bars by the toilet and in the tub and shower. Do not use towel bars as grab bars.  Use non-skid mats or decals in the tub or  shower.  If you need to sit down in the shower, use a plastic, non-slip stool.  Keep the floor dry. Clean up any water that spills on the floor as soon as it happens.  Remove soap buildup in the tub or shower regularly.  Attach bath mats securely with double-sided non-slip rug tape.  Do not have throw rugs and other things on the floor that can make you trip. What can I do in the bedroom?  Use night lights.  Make sure that you have a light by your bed that is easy to reach.  Do not use any sheets or blankets that are too big for your bed. They should not hang down onto the floor.  Have a firm chair that has side arms. You can use this for support while you get dressed.  Do not have throw rugs and other things on the floor that can make you trip. What can I do in the kitchen?  Clean up any spills right away.  Avoid walking on wet floors.  Keep items that you use a lot in easy-to-reach places.  If you need to reach something above you, use a strong step stool that has a grab bar.  Keep  electrical cords out of the way.  Do not use floor polish or wax that makes floors slippery. If you must use wax, use non-skid floor wax.  Do not have throw rugs and other things on the floor that can make you trip. What can I do with my stairs?  Do not leave any items on the stairs.  Make sure that there are handrails on both sides of the stairs and use them. Fix handrails that are broken or loose. Make sure that handrails are as long as the stairways.  Check any carpeting to make sure that it is firmly attached to the stairs. Fix any carpet that is loose or worn.  Avoid having throw rugs at the top or bottom of the stairs. If you do have throw rugs, attach them to the floor with carpet tape.  Make sure that you have a light switch at the top of the stairs and the bottom of the stairs. If you do not have them, ask someone to add them for you. What else can I do to help prevent falls?   Wear shoes that:  Do not have high heels.  Have rubber bottoms.  Are comfortable and fit you well.  Are closed at the toe. Do not wear sandals.  If you use a stepladder:  Make sure that it is fully opened. Do not climb a closed stepladder.  Make sure that both sides of the stepladder are locked into place.  Ask someone to hold it for you, if possible.  Clearly mark and make sure that you can see:  Any grab bars or handrails.  First and last steps.  Where the edge of each step is.  Use tools that help you move around (mobility aids) if they are needed. These include:  Canes.  Walkers.  Scooters.  Crutches.  Turn on the lights when you go into a dark area. Replace any light bulbs as soon as they burn out.  Set up your furniture so you have a clear path. Avoid moving your furniture around.  If any of your floors are uneven, fix them.  If there are any pets around you, be aware of where they are.  Review your medicines with your doctor. Some medicines can make you feel dizzy. This can increase your chance of falling. Ask your doctor what other things that you can do to help prevent falls. This information is not intended to replace advice given to you by your health care provider. Make sure you discuss any questions you have with your health care provider. Document Released: 03/16/2009 Document Revised: 10/26/2015 Document Reviewed: 06/24/2014 Elsevier Interactive Patient Education  2017 [employer].
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
## 2019-03-03 ENCOUNTER — Other Ambulatory Visit: Payer: Self-pay | Admitting: Internal Medicine

## 2019-03-03 ENCOUNTER — Other Ambulatory Visit: Payer: Self-pay

## 2019-03-03 ENCOUNTER — Encounter: Payer: Self-pay | Admitting: Internal Medicine

## 2019-03-03 ENCOUNTER — Ambulatory Visit (INDEPENDENT_AMBULATORY_CARE_PROVIDER_SITE_OTHER): Payer: Medicare Other | Admitting: Internal Medicine

## 2019-03-03 VITALS — BP 124/78 | HR 96 | Ht 62.0 in | Wt 180.0 lb

## 2019-03-03 DIAGNOSIS — Z516 Encounter for desensitization to allergens: Secondary | ICD-10-CM

## 2019-03-03 DIAGNOSIS — I10 Essential (primary) hypertension: Secondary | ICD-10-CM

## 2019-03-03 DIAGNOSIS — K219 Gastro-esophageal reflux disease without esophagitis: Secondary | ICD-10-CM | POA: Diagnosis not present

## 2019-03-03 DIAGNOSIS — E1169 Type 2 diabetes mellitus with other specified complication: Secondary | ICD-10-CM

## 2019-03-03 DIAGNOSIS — Z23 Encounter for immunization: Secondary | ICD-10-CM

## 2019-03-03 DIAGNOSIS — E785 Hyperlipidemia, unspecified: Secondary | ICD-10-CM | POA: Diagnosis not present

## 2019-03-03 DIAGNOSIS — K449 Diaphragmatic hernia without obstruction or gangrene: Secondary | ICD-10-CM | POA: Diagnosis not present

## 2019-03-03 DIAGNOSIS — N393 Stress incontinence (female) (male): Secondary | ICD-10-CM

## 2019-03-03 DIAGNOSIS — E118 Type 2 diabetes mellitus with unspecified complications: Secondary | ICD-10-CM | POA: Diagnosis not present

## 2019-03-03 LAB — POCT URINALYSIS DIPSTICK
Bilirubin, UA: NEGATIVE
Blood, UA: NEGATIVE
Glucose, UA: NEGATIVE
Ketones, UA: NEGATIVE
Leukocytes, UA: NEGATIVE
Nitrite, UA: NEGATIVE
Protein, UA: NEGATIVE
Spec Grav, UA: 1.02 (ref 1.010–1.025)
Urobilinogen, UA: 0.2 E.U./dL
pH, UA: 5 (ref 5.0–8.0)

## 2019-03-03 NOTE — Progress Notes (Signed)
Date:  03/03/2019   Name:  Pamela Rosario   DOB:  11/19/1950   MRN:  [id number]   Chief Complaint: Annual Exam (Flu shot- high dose.) and Diabetes (Foot Exam.) Pamela Rosario is a 68 y.o. female who presents today for her annual exam. She feels well. She reports exercising remodeled her house. She reports she is sleeping fairly well. She recently had mammogram and colonoscopy.    Mammogram 02/2019 Colonoscopy 02/2019 Immunizations up to date - PPV-23 next spring  Hypertension This is a chronic problem. The problem is controlled (controlled at home 120/78). Pertinent negatives include no chest pain, headaches, palpitations, peripheral edema or shortness of breath. Past treatments include angiotensin blockers and diuretics.  Diabetes She presents for her follow-up diabetic visit. She has type 2 diabetes mellitus. Pertinent negatives for hypoglycemia include no dizziness, headaches, nervousness/anxiousness or tremors. Pertinent negatives for diabetes include no chest pain, no fatigue, no polydipsia, no polyuria and no weight loss. There are no hypoglycemic complications. Her weight is stable. She is following a generally healthy diet. She monitors blood glucose at home 1-2 x per day. Her breakfast blood glucose is taken between 6-7 am. Her breakfast blood glucose range is generally 130-140 mg/dl. An ACE inhibitor/angiotensin II receptor blocker is being taken. Eye exam is current.  Hyperlipidemia Pertinent negatives include no chest pain or shortness of breath.  Gastroesophageal Reflux She reports no abdominal pain, no chest pain, no coughing or no wheezing. Pertinent negatives include no fatigue or weight loss.  Allergies - she continues on immunization therapy through ENT.  Doing well, using xyzal once a week and flonase as needed.  Lab Results  Component Value Date   HGBA1C 6.6 (H) 09/28/2018   Lab Results  Component Value Date   CHOL 143 02/26/2018   HDL 35 (L) 02/26/2018   LDLCALC 62  02/26/2018   TRIG 229 (H) 02/26/2018   CHOLHDL 4.1 02/26/2018   Lab Results  Component Value Date   CREATININE 0.79 09/28/2018   BUN 20 09/28/2018   NA 141 09/28/2018   K 4.6 09/28/2018   CL 103 09/28/2018   CO2 24 09/28/2018     Review of Systems  Constitutional: Negative for chills, fatigue, fever and weight loss.  HENT: Negative for congestion, hearing loss, tinnitus, trouble swallowing and voice change.   Eyes: Negative for visual disturbance.  Respiratory: Negative for cough, chest tightness, shortness of breath and wheezing.   Cardiovascular: Negative for chest pain, palpitations and leg swelling.  Gastrointestinal: Negative for abdominal pain, constipation, diarrhea and vomiting.  Endocrine: Negative for polydipsia and polyuria.  Genitourinary: Negative for dysuria, frequency, genital sores, vaginal bleeding and vaginal discharge.  Musculoskeletal: Negative for arthralgias, gait problem and joint swelling.  Skin: Negative for color change and rash.  Allergic/Immunologic: Positive for environmental allergies.  Neurological: Negative for dizziness, tremors, light-headedness and headaches.  Hematological: Negative for adenopathy. Does not bruise/bleed easily.  Psychiatric/Behavioral: Negative for dysphoric mood and sleep disturbance. The patient is not nervous/anxious.   OAB - stopped meds recently and still doing well.  Stopped due to dry mouth but meds did work well.  Patient Active Problem List   Diagnosis Date Noted  . Personal history of colonic polyps   . Polyp of sigmoid colon   . Stress incontinence of urine 02/25/2017  . Allergy desensitization therapy 01/20/2017  . Hiatal hernia with GERD without esophagitis 09/06/2016  . Fibrocystic breast changes of both breasts 09/06/2016  . Intraductal papilloma of breast, left 09/06/2016  .  Hyperlipidemia associated with type 2 diabetes mellitus ([place]) 09/06/2016  . Essential hypertension 09/06/2016  . Type II diabetes  mellitus with complication ([place]) [id number]  . Thyroid nodule 09/06/2016  . Adenomatous colon polyp 09/06/2016    Allergies  Allergen Reactions  . Demerol [Meperidine] Hives  . Tetracyclines & Related Hives  . Simvastatin Other (See Comments)    Muscle pain    Past Surgical History:  Procedure Laterality Date  . brachial cleft cyst excison ( Right Neck )  1971  . BREAST EXCISIONAL BIOPSY Left    intraductal papilloma  . BREAST SURGERY  2007   L intraductal papilloma excision  . [place]   emergency  . COLONOSCOPY  08/24/2013   tubular adenoma  . COLONOSCOPY WITH PROPOFOL N/A 02/05/2019   Procedure: COLONOSCOPY WITH BIOPSIES;  Surgeon: Lucilla Lame, MD;  Location: [place];  Service: Endoscopy;  Laterality: N/A;  Diabetic - oral meds  . ESOPHAGOGASTRODUODENOSCOPY  2005   hiatal hernia & polyp found  . EXCISION / BIOPSY BREAST / NIPPLE / DUCT Left 2005  . LARYNGOSCOPY  2006  . left intraductal papilloma excision of left breast  2007  . POLYPECTOMY N/A 02/05/2019   Procedure: POLYPECTOMY;  Surgeon: Lucilla Lame, MD;  Location: [place];  Service: Endoscopy;  Laterality: N/A;  . TONSILECTOMY, ADENOIDECTOMY, BILATERAL MYRINGOTOMY AND TUBES      Social History   Tobacco Use  . Smoking status: Never Smoker  . Smokeless tobacco: Never Used  . Tobacco comment: smoking cessation materials not required  Substance Use Topics  . Alcohol use: No  . Drug use: No     Medication list has been reviewed and updated.  Current Meds  Medication Sig  . atorvastatin (LIPITOR) 20 MG tablet TAKE 1 TABLET (20 MG TOTAL) BY MOUTH DAILY.  . fluticasone (FLONASE) 50 MCG/ACT nasal spray Place 2 sprays into both nostrils daily.  Marland Kitchen glucose blood test strip 1 each by Other route as needed for other. Use as instructed  . levocetirizine (XYZAL) 5 MG tablet Take 5 mg by mouth every evening. PRN  . lisinopril (ZESTRIL) 10 MG tablet Take 1 tablet (10 mg total) by mouth  daily.  . metFORMIN (GLUCOPHAGE) 500 MG tablet TAKE 1/2 TABLET (250MG      TOTAL) DAILY  . omeprazole (PRILOSEC) 20 MG capsule Take 1 capsule (20 mg total) by mouth 2 (two) times daily before a meal.  . tolterodine (DETROL) 2 MG tablet TAKE 1 TABLET (2 MG TOTAL) BY MOUTH 2 (TWO) TIMES DAILY.  Marland Kitchen UNABLE TO FIND Allergy Shots- [facility].  Started- 01/2017    PHQ 2/9 Scores 03/01/2019 09/28/2018 02/25/2018 01/27/2017  PHQ - 2 Score 0 0 0 0  PHQ- 9 Score - - 0 -    BP Readings from Last 3 Encounters:  03/03/19 124/78  03/01/19 112/72  02/05/19 114/64    Physical Exam Vitals signs and nursing note reviewed.  Constitutional:      General: She is not in acute distress.    Appearance: She is well-developed.  HENT:     Head: Normocephalic and atraumatic.     Right Ear: Tympanic membrane and ear canal normal.     Left Ear: Tympanic membrane and ear canal normal.     Nose:     Right Sinus: No maxillary sinus tenderness.     Left Sinus: No maxillary sinus tenderness.  Eyes:     General: No scleral icterus.  Right eye: No discharge.        Left eye: No discharge.     Conjunctiva/sclera: Conjunctivae normal.  Neck:     Musculoskeletal: Normal range of motion. No erythema.     Thyroid: No thyromegaly.     Vascular: No carotid bruit.  Cardiovascular:     Rate and Rhythm: Normal rate and regular rhythm.     Pulses: Normal pulses.     Heart sounds: Normal heart sounds.  Pulmonary:     Effort: Pulmonary effort is normal. No respiratory distress.     Breath sounds: No wheezing.  Chest:     Breasts:        Right: No mass, nipple discharge, skin change or tenderness.        Left: No mass, nipple discharge, skin change or tenderness.  Abdominal:     General: Bowel sounds are normal.     Palpations: Abdomen is soft.     Tenderness: There is no abdominal tenderness.  Musculoskeletal: Normal range of motion.     Right lower leg: No edema.     Left lower leg: No edema.   Lymphadenopathy:     Cervical: No cervical adenopathy.  Skin:    General: Skin is warm and dry.     Capillary Refill: Capillary refill takes less than 2 seconds.     Findings: No rash.  Neurological:     General: No focal deficit present.     Mental Status: She is alert and oriented to person, place, and time.     Cranial Nerves: No cranial nerve deficit.     Sensory: No sensory deficit.     Deep Tendon Reflexes: Reflexes are normal and symmetric.  Psychiatric:        Speech: Speech normal.        Behavior: Behavior normal.        Thought Content: Thought content normal.     Wt Readings from Last 3 Encounters:  03/03/19 180 lb (81.6 kg)  03/01/19 182 lb 3.2 oz (82.6 kg)  02/05/19 177 lb 14.4 oz (80.7 kg)    BP 124/78   Pulse 96   Ht 5\' 2"  (1.575 m)   Wt 180 lb (81.6 kg)   SpO2 97%   BMI 32.92 kg/m   Assessment and Plan: 1. Essential hypertension Clinically stable exam with well controlled BP.   Tolerating medications, lisinopril 10 mg, without side effects at this time. Pt to continue current regimen and low sodium diet; benefits of regular exercise as able discussed. - CBC with Differential/Platelet - Comprehensive metabolic panel - POCT urinalysis dipstick  2. Type II diabetes mellitus with complication (HCC) Clinically stable by exam and report without s/s of hypoglycemia. DM complicated by HTN, lipids. Tolerating medications metformin 250 mg daily, well without side effects or other concerns. Eye exam up to date. On ARB - Hemoglobin A1c - TSH  3. Hyperlipidemia associated with type 2 diabetes mellitus ([place]) Tolerating statin medication without side effects at this time LDL is at goal of < 70 on current dose Continue same therapy without change at this time. - Lipid panel  4. Allergy desensitization therapy On injections from ENT and doing well  5. Hiatal hernia with GERD without esophagitis Symptoms well controlled on daily PPI No red flag signs such  as weight loss, n/v, melena Will continue omeprazole 20 mg daily.  6. Need for immunization against influenza - Flu Vaccine QUAD High Dose(Fluad)  7. Stress incontinence of urine Off meds  for now - call if new Rx is needed   Partially dictated using Editor, commissioning. Any errors are unintentional.  Halina Maidens, MD [facility]  03/03/2019

## 2019-03-04 LAB — COMPREHENSIVE METABOLIC PANEL
ALT: 23 IU/L (ref 0–32)
AST: 16 IU/L (ref 0–40)
Albumin/Globulin Ratio: 1.6 (ref 1.2–2.2)
Albumin: 4.5 g/dL (ref 3.8–4.8)
Alkaline Phosphatase: 95 IU/L (ref 39–117)
BUN/Creatinine Ratio: 19 (ref 12–28)
BUN: 17 mg/dL (ref 8–27)
Bilirubin Total: 0.3 mg/dL (ref 0.0–1.2)
CO2: 24 mmol/L (ref 20–29)
Calcium: 9.6 mg/dL (ref 8.7–10.3)
Chloride: 102 mmol/L (ref 96–106)
Creatinine, Ser: 0.9 mg/dL (ref 0.57–1.00)
GFR calc Af Amer: 77 mL/min/{1.73_m2} (ref >59)
GFR calc non Af Amer: 66 mL/min/{1.73_m2} (ref >59)
Globulin, Total: 2.8 g/dL (ref 1.5–4.5)
Glucose: 108 mg/dL — ABNORMAL HIGH (ref 65–99)
Potassium: 4.8 mmol/L (ref 3.5–5.2)
Sodium: 138 mmol/L (ref 134–144)
Total Protein: 7.3 g/dL (ref 6.0–8.5)

## 2019-03-04 LAB — LIPID PANEL
Chol/HDL Ratio: 4.3 ratio (ref 0.0–4.4)
Cholesterol, Total: 156 mg/dL (ref 100–199)
HDL: 36 mg/dL — ABNORMAL LOW (ref >39)
LDL Chol Calc (NIH): 87 mg/dL (ref 0–99)
Triglycerides: 191 mg/dL — ABNORMAL HIGH (ref 0–149)
VLDL Cholesterol Cal: 33 mg/dL (ref 5–40)

## 2019-03-04 LAB — CBC WITH DIFFERENTIAL/PLATELET
Basophils Absolute: 0 10*3/uL (ref 0.0–0.2)
Basos: 0 %
EOS (ABSOLUTE): 0.1 10*3/uL (ref 0.0–0.4)
Eos: 2 %
Hematocrit: 43 % (ref 34.0–46.6)
Hemoglobin: 13.8 g/dL (ref 11.1–15.9)
Immature Grans (Abs): 0 10*3/uL (ref 0.0–0.1)
Immature Granulocytes: 0 %
Lymphocytes Absolute: 2.8 10*3/uL (ref 0.7–3.1)
Lymphs: 38 %
MCH: 27.1 pg (ref 26.6–33.0)
MCHC: 32.1 g/dL (ref 31.5–35.7)
MCV: 84 fL (ref 79–97)
Monocytes Absolute: 0.7 10*3/uL (ref 0.1–0.9)
Monocytes: 9 %
Neutrophils Absolute: 3.9 10*3/uL (ref 1.4–7.0)
Neutrophils: 51 %
Platelets: 258 10*3/uL (ref 150–450)
RBC: 5.1 x10E6/uL (ref 3.77–5.28)
RDW: 14 % (ref 11.7–15.4)
WBC: 7.4 10*3/uL (ref 3.4–10.8)

## 2019-03-04 LAB — HEMOGLOBIN A1C
Est. average glucose Bld gHb Est-mCnc: 154 mg/dL
Hgb A1c MFr Bld: 7 % — ABNORMAL HIGH (ref 4.8–5.6)

## 2019-03-04 LAB — TSH: TSH: 1.77 u[IU]/mL (ref 0.450–4.500)

## 2019-03-12 DIAGNOSIS — J301 Allergic rhinitis due to pollen: Secondary | ICD-10-CM | POA: Diagnosis not present

## 2019-03-30 DIAGNOSIS — J301 Allergic rhinitis due to pollen: Secondary | ICD-10-CM | POA: Diagnosis not present

## 2019-04-28 ENCOUNTER — Other Ambulatory Visit

## 2019-05-04 ENCOUNTER — Ambulatory Visit
Admission: RE | Admit: 2019-05-04 | Discharge: 2019-05-04 | Disposition: A | Discharge status: Home / Self Care | Payer: Medicare Other | Source: Ambulatory Visit | Attending: Internal Medicine | Admitting: Internal Medicine

## 2019-05-04 ENCOUNTER — Other Ambulatory Visit: Payer: Self-pay | Admitting: Internal Medicine

## 2019-05-04 ENCOUNTER — Other Ambulatory Visit: Payer: Self-pay

## 2019-05-04 DIAGNOSIS — Z780 Asymptomatic menopausal state: Secondary | ICD-10-CM | POA: Insufficient documentation

## 2019-06-09 DIAGNOSIS — J301 Allergic rhinitis due to pollen: Secondary | ICD-10-CM | POA: Diagnosis not present

## 2019-06-21 ENCOUNTER — Other Ambulatory Visit

## 2019-06-25 ENCOUNTER — Other Ambulatory Visit: Payer: Self-pay | Admitting: Internal Medicine

## 2019-06-25 DIAGNOSIS — N393 Stress incontinence (female) (male): Secondary | ICD-10-CM

## 2019-07-19 ENCOUNTER — Other Ambulatory Visit: Payer: Self-pay | Admitting: Internal Medicine

## 2019-08-05 DIAGNOSIS — E119 Type 2 diabetes mellitus without complications: Secondary | ICD-10-CM | POA: Diagnosis not present

## 2019-08-05 LAB — HM DIABETES EYE EXAM

## 2019-08-08 ENCOUNTER — Encounter: Payer: Self-pay | Admitting: Internal Medicine

## 2019-08-24 ENCOUNTER — Ambulatory Visit (INDEPENDENT_AMBULATORY_CARE_PROVIDER_SITE_OTHER): Payer: Medicare Other | Admitting: Internal Medicine

## 2019-08-24 ENCOUNTER — Encounter: Payer: Self-pay | Admitting: Internal Medicine

## 2019-08-24 ENCOUNTER — Other Ambulatory Visit: Payer: Self-pay

## 2019-08-24 VITALS — BP 130/68 | HR 85 | Temp 96.2°F | Ht 62.0 in | Wt 188.0 lb

## 2019-08-24 DIAGNOSIS — I10 Essential (primary) hypertension: Secondary | ICD-10-CM | POA: Diagnosis not present

## 2019-08-24 DIAGNOSIS — Z1231 Encounter for screening mammogram for malignant neoplasm of breast: Secondary | ICD-10-CM

## 2019-08-24 DIAGNOSIS — E041 Nontoxic single thyroid nodule: Secondary | ICD-10-CM | POA: Diagnosis not present

## 2019-08-24 DIAGNOSIS — E118 Type 2 diabetes mellitus with unspecified complications: Secondary | ICD-10-CM

## 2019-08-24 NOTE — Progress Notes (Signed)
Date:  08/24/2019   Name:  Pamela Rosario   DOB:  31-May-1951   MRN:  [id number]   Chief Complaint: Diabetes (4 month follow up.) and Hypertension  Diabetes She presents for her follow-up diabetic visit. She has type 2 diabetes mellitus. Her disease course has been stable. Pertinent negatives for hypoglycemia include no headaches or tremors. Pertinent negatives for diabetes include no chest pain, no fatigue, no polydipsia and no polyuria. Current diabetic treatment includes oral agent (monotherapy) (metformin). Her weight is stable. She rarely participates in exercise. She monitors blood glucose at home 1-2 x per day. Her breakfast blood glucose is taken between 6-7 am. Her breakfast blood glucose range is generally 110-130 mg/dl. An ACE inhibitor/angiotensin II receptor blocker is being taken.  Hypertension This is a chronic problem. The problem is unchanged. The problem is controlled. Pertinent negatives include no chest pain, headaches, palpitations or shortness of breath. Past treatments include ACE inhibitors. The current treatment provides significant improvement. Identifiable causes of hypertension include a thyroid problem.  Thyroid Problem Presents for follow-up visit. Patient reports no constipation, fatigue, palpitations, tremors or weight gain. (Bilateral small nodules)   Immunization History  Administered Date(s) Administered  . Fluad Quad(high Dose 65+) 03/03/2019  . Hepatitis B 06/03/1990  . IPV 06/04/1955, 06/03/1966  . Influenza, High Dose Seasonal PF 02/26/2018  . Influenza,inj,Quad PF,6+ Mos 02/25/2017  . Influenza-Unspecified 01/02/2016  . MMR 06/03/1981  . Pneumococcal Conjugate-13 09/28/2018  . Pneumococcal Polysaccharide-23 11/15/2009  . Tdap 06/03/2012  . Varicella 06/03/1969  . Zoster Recombinat (Shingrix) 08/14/2017, 12/22/2017, 06/10/2018    Lab Results  Component Value Date   CREATININE 0.90 03/03/2019   BUN 17 03/03/2019   NA 138 03/03/2019   K 4.8  03/03/2019   CL 102 03/03/2019   CO2 24 03/03/2019   Lab Results  Component Value Date   CHOL 156 03/03/2019   HDL 36 (L) 03/03/2019   LDLCALC 87 03/03/2019   TRIG 191 (H) 03/03/2019   CHOLHDL 4.3 03/03/2019   Lab Results  Component Value Date   TSH 1.770 03/03/2019   Lab Results  Component Value Date   HGBA1C 7.0 (H) 03/03/2019   Lab Results  Component Value Date   WBC 7.4 03/03/2019   HGB 13.8 03/03/2019   HCT 43.0 03/03/2019   MCV 84 03/03/2019   PLT 258 03/03/2019   Lab Results  Component Value Date   ALT 23 03/03/2019   AST 16 03/03/2019   ALKPHOS 95 03/03/2019   BILITOT 0.3 03/03/2019     Review of Systems  Constitutional: Negative for appetite change, fatigue, fever, unexpected weight change and weight gain.  HENT: Negative for tinnitus.   Eyes: Negative for visual disturbance.  Respiratory: Negative for cough, chest tightness and shortness of breath.   Cardiovascular: Negative for chest pain, palpitations and leg swelling.  Gastrointestinal: Negative for abdominal pain and constipation.       Occasional gerd  Endocrine: Negative for polydipsia and polyuria.  Genitourinary: Negative for dysuria and hematuria.  Musculoskeletal: Negative for arthralgias.  Neurological: Negative for tremors, numbness and headaches.  Psychiatric/Behavioral: Negative for dysphoric mood.    Patient Active Problem List   Diagnosis Date Noted  . Personal history of colonic polyps   . Polyp of sigmoid colon   . Stress incontinence of urine 02/25/2017  . Allergy desensitization therapy 01/20/2017  . Hiatal hernia with GERD without esophagitis 09/06/2016  . Fibrocystic breast changes of both breasts 09/06/2016  . Intraductal papilloma of  breast, left 09/06/2016  . Hyperlipidemia associated with type 2 diabetes mellitus ([place]) 09/06/2016  . Essential hypertension 09/06/2016  . Type II diabetes mellitus with complication ([place]) 56/70/1410  . Thyroid nodule 09/06/2016  .  Adenomatous colon polyp 09/06/2016    Allergies  Allergen Reactions  . Demerol [Meperidine] Hives  . Tetracyclines & Related Hives  . Simvastatin Other (See Comments)    Muscle pain    Past Surgical History:  Procedure Laterality Date  . brachial cleft cyst excison ( Right Neck )  1971  . BREAST EXCISIONAL BIOPSY Left    intraductal papilloma  . BREAST SURGERY  2007   L intraductal papilloma excision  . [place]   emergency  . COLONOSCOPY  08/24/2013   tubular adenoma  . COLONOSCOPY WITH PROPOFOL N/A 02/05/2019   Procedure: COLONOSCOPY WITH BIOPSIES;  Surgeon: Lucilla Lame, MD;  Location: [place];  Service: Endoscopy;  Laterality: N/A;  Diabetic - oral meds  . ESOPHAGOGASTRODUODENOSCOPY  2005   hiatal hernia & polyp found  . EXCISION / BIOPSY BREAST / NIPPLE / DUCT Left 2005  . LARYNGOSCOPY  2006  . left intraductal papilloma excision of left breast  2007  . POLYPECTOMY N/A 02/05/2019   Procedure: POLYPECTOMY;  Surgeon: Lucilla Lame, MD;  Location: [place];  Service: Endoscopy;  Laterality: N/A;  . TONSILECTOMY, ADENOIDECTOMY, BILATERAL MYRINGOTOMY AND TUBES      Social History   Tobacco Use  . Smoking status: Never Smoker  . Smokeless tobacco: Never Used  . Tobacco comment: smoking cessation materials not required  Substance Use Topics  . Alcohol use: No  . Drug use: No     Medication list has been reviewed and updated.  Current Meds  Medication Sig  . atorvastatin (LIPITOR) 20 MG tablet TAKE 1 TABLET (20 MG TOTAL) BY MOUTH DAILY.  . fluticasone (FLONASE) 50 MCG/ACT nasal spray Place 2 sprays into both nostrils daily.  Marland Kitchen glucose blood test strip 1 each by Other route as needed for other. Use as instructed  . levocetirizine (XYZAL) 5 MG tablet Take 5 mg by mouth every evening. PRN  . lisinopril (ZESTRIL) 10 MG tablet TAKE 1 TABLET DAILY  . metFORMIN (GLUCOPHAGE) 500 MG tablet TAKE 1/2 TABLET (250MG     TOTAL) DAILY  .  omeprazole (PRILOSEC) 20 MG capsule Take 1 capsule (20 mg total) by mouth 2 (two) times daily before a meal.  . UNABLE TO FIND Allergy Shots- [facility].  Started- 01/2017  . [DISCONTINUED] tolterodine (DETROL) 2 MG tablet TAKE 1 TABLET (2 MG TOTAL) BY MOUTH 2 (TWO) TIMES DAILY.    PHQ 2/9 Scores 08/24/2019 03/01/2019 09/28/2018 02/25/2018  PHQ - 2 Score 0 0 0 0  PHQ- 9 Score 0 - - 0    BP Readings from Last 3 Encounters:  08/24/19 130/68  03/03/19 124/78  03/01/19 112/72    Physical Exam Vitals and nursing note reviewed.  Constitutional:      General: She is not in acute distress.    Appearance: She is well-developed.  HENT:     Head: Normocephalic and atraumatic.  Neck:     Vascular: No carotid bruit.  Cardiovascular:     Rate and Rhythm: Normal rate and regular rhythm.     Pulses: Normal pulses.     Heart sounds: No murmur.  Pulmonary:     Effort: Pulmonary effort is normal. No respiratory distress.     Breath sounds: No wheezing or rhonchi.  Musculoskeletal:     Cervical back: Normal range of motion.     Right lower leg: No edema.     Left lower leg: No edema.  Lymphadenopathy:     Cervical: No cervical adenopathy.  Skin:    General: Skin is warm and dry.     Findings: No rash.  Neurological:     General: No focal deficit present.     Mental Status: She is alert and oriented to person, place, and time.  Psychiatric:        Mood and Affect: Mood normal.        Behavior: Behavior normal.     Wt Readings from Last 3 Encounters:  08/24/19 188 lb (85.3 kg)  03/03/19 180 lb (81.6 kg)  03/01/19 182 lb 3.2 oz (82.6 kg)    BP 130/68   Pulse 85   Temp (!) 96.2 F (35.7 C) (Temporal)   Ht 5' 2"  (1.575 m)   Wt 188 lb (85.3 kg)   SpO2 97%   BMI 34.39 kg/m   Assessment and Plan: 1. Essential hypertension Clinically stable exam with well controlled BP on lisinopril. Tolerating medications without side effects at this time. Pt to continue current  regimen and low sodium diet; benefits of regular exercise as able discussed. - Basic metabolic panel  2. Type II diabetes mellitus with complication (HCC) Clinically stable by exam and report without s/s of hypoglycemia. DM complicated by HTN, lipids. Tolerating medications well without side effects or other concerns. Eye exam up to date - Hemoglobin A1c  3. Encounter for screening mammogram for breast cancer - MM 3D SCREEN BREAST BILATERAL; Future  4. Thyroid nodule Due for follow up US for stability Last TSH was normal - US THYROID; Future   Partially dictated using Editor, commissioning. Any errors are unintentional.  Halina Maidens, MD [facility]  08/24/2019

## 2019-08-25 LAB — HEMOGLOBIN A1C
Est. average glucose Bld gHb Est-mCnc: 163 mg/dL
Hgb A1c MFr Bld: 7.3 % — ABNORMAL HIGH (ref 4.8–5.6)

## 2019-08-25 LAB — BASIC METABOLIC PANEL
BUN/Creatinine Ratio: 19 (ref 12–28)
BUN: 17 mg/dL (ref 8–27)
CO2: 24 mmol/L (ref 20–29)
Calcium: 9.5 mg/dL (ref 8.7–10.3)
Chloride: 104 mmol/L (ref 96–106)
Creatinine, Ser: 0.9 mg/dL (ref 0.57–1.00)
GFR calc Af Amer: 77 mL/min/{1.73_m2} (ref >59)
GFR calc non Af Amer: 66 mL/min/{1.73_m2} (ref >59)
Glucose: 166 mg/dL — ABNORMAL HIGH (ref 65–99)
Potassium: 4.5 mmol/L (ref 3.5–5.2)
Sodium: 141 mmol/L (ref 134–144)

## 2019-08-27 DIAGNOSIS — J301 Allergic rhinitis due to pollen: Secondary | ICD-10-CM | POA: Diagnosis not present

## 2019-08-30 ENCOUNTER — Other Ambulatory Visit: Payer: Self-pay

## 2019-08-30 ENCOUNTER — Ambulatory Visit
Admission: RE | Admit: 2019-08-30 | Discharge: 2019-08-30 | Disposition: A | Discharge status: Home / Self Care | Payer: Medicare Other | Source: Ambulatory Visit | Attending: Internal Medicine | Admitting: Internal Medicine

## 2019-08-30 DIAGNOSIS — E042 Nontoxic multinodular goiter: Secondary | ICD-10-CM | POA: Diagnosis not present

## 2019-08-30 DIAGNOSIS — E041 Nontoxic single thyroid nodule: Secondary | ICD-10-CM | POA: Insufficient documentation

## 2019-09-06 DIAGNOSIS — J301 Allergic rhinitis due to pollen: Secondary | ICD-10-CM | POA: Diagnosis not present

## 2019-09-08 ENCOUNTER — Encounter: Payer: Self-pay | Admitting: Internal Medicine

## 2019-09-09 ENCOUNTER — Other Ambulatory Visit: Payer: Self-pay | Admitting: Internal Medicine

## 2019-09-09 DIAGNOSIS — K219 Gastro-esophageal reflux disease without esophagitis: Secondary | ICD-10-CM

## 2019-09-09 MED ORDER — OMEPRAZOLE 20 MG PO CPDR: 20.0000 mg | DELAYED_RELEASE_CAPSULE | Freq: Two times a day (BID) | ORAL | 3 refills | Status: DC

## 2020-01-28 ENCOUNTER — Other Ambulatory Visit: Payer: Self-pay | Admitting: Internal Medicine

## 2020-01-28 DIAGNOSIS — E119 Type 2 diabetes mellitus without complications: Secondary | ICD-10-CM

## 2020-01-28 NOTE — Telephone Encounter (Signed)
Requested Prescriptions  Pending Prescriptions Disp Refills  . metFORMIN (GLUCOPHAGE) 500 MG tablet [Pharmacy Med Name: METFORMIN TAB 500MG] 45 tablet 0    Sig: TAKE 1/2 TABLET DAILY     Endocrinology:  Diabetes - Biguanides Passed - 01/28/2020  8:12 AM      Passed - Cr in normal range and within 360 days    Creatinine, Ser  Date Value Ref Range Status  08/24/2019 0.90 0.57 - 1.00 mg/dL Final         Passed - HBA1C is between 0 and 7.9 and within 180 days    Hgb A1c MFr Bld  Date Value Ref Range Status  08/24/2019 7.3 (H) 4.8 - 5.6 % Final    Comment:             Prediabetes: 5.7 - 6.4          Diabetes: >6.4          Glycemic control for adults with diabetes: <7.0          Passed - eGFR in normal range and within 360 days    GFR calc Af Amer  Date Value Ref Range Status  08/24/2019 77 >59 mL/min/1.73 Final   GFR calc non Af Amer  Date Value Ref Range Status  08/24/2019 66 >59 mL/min/1.73 Final         Passed - Valid encounter within last 6 months    Recent Outpatient Visits          5 months ago Essential hypertension   [facility] Glean Hess, MD   11 months ago Essential hypertension   [facility] Glean Hess, MD   1 year ago Type II diabetes mellitus with complication [facility])   [facility] Glean Hess, MD   1 year ago Acute cystitis without hematuria   [facility] Glean Hess, MD   1 year ago Essential hypertension   [facility] Glean Hess, MD      Future Appointments            In 1 month Army Melia, Jesse Sans, MD [facility]

## 2020-02-01 DIAGNOSIS — J301 Allergic rhinitis due to pollen: Secondary | ICD-10-CM | POA: Diagnosis not present

## 2020-02-22 ENCOUNTER — Other Ambulatory Visit: Payer: Self-pay

## 2020-02-22 ENCOUNTER — Ambulatory Visit
Admission: RE | Admit: 2020-02-22 | Discharge: 2020-02-22 | Disposition: A | Discharge status: Home / Self Care | Payer: Medicare Other | Source: Ambulatory Visit | Attending: Internal Medicine | Admitting: Internal Medicine

## 2020-02-22 DIAGNOSIS — Z1231 Encounter for screening mammogram for malignant neoplasm of breast: Secondary | ICD-10-CM | POA: Diagnosis not present

## 2020-02-22 DIAGNOSIS — J301 Allergic rhinitis due to pollen: Secondary | ICD-10-CM | POA: Diagnosis not present

## 2020-02-26 ENCOUNTER — Other Ambulatory Visit: Payer: Self-pay | Admitting: Internal Medicine

## 2020-03-01 ENCOUNTER — Ambulatory Visit: Payer: Medicare Other

## 2020-03-03 ENCOUNTER — Encounter: Payer: Medicare Other | Admitting: Internal Medicine

## 2020-03-06 ENCOUNTER — Ambulatory Visit (INDEPENDENT_AMBULATORY_CARE_PROVIDER_SITE_OTHER): Payer: Medicare Other

## 2020-03-06 ENCOUNTER — Ambulatory Visit (INDEPENDENT_AMBULATORY_CARE_PROVIDER_SITE_OTHER): Payer: Medicare Other | Admitting: Internal Medicine

## 2020-03-06 ENCOUNTER — Other Ambulatory Visit: Payer: Self-pay

## 2020-03-06 ENCOUNTER — Encounter: Payer: Self-pay | Admitting: Internal Medicine

## 2020-03-06 VITALS — BP 118/68 | HR 90 | Ht 62.0 in | Wt 186.0 lb

## 2020-03-06 VITALS — BP 118/68 | HR 90 | Temp 98.2°F | Resp 16 | Ht 62.0 in | Wt 186.4 lb

## 2020-03-06 DIAGNOSIS — K219 Gastro-esophageal reflux disease without esophagitis: Secondary | ICD-10-CM

## 2020-03-06 DIAGNOSIS — Z0000 Encounter for general adult medical examination without abnormal findings: Secondary | ICD-10-CM | POA: Diagnosis not present

## 2020-03-06 DIAGNOSIS — E785 Hyperlipidemia, unspecified: Secondary | ICD-10-CM | POA: Diagnosis not present

## 2020-03-06 DIAGNOSIS — E041 Nontoxic single thyroid nodule: Secondary | ICD-10-CM

## 2020-03-06 DIAGNOSIS — Z23 Encounter for immunization: Secondary | ICD-10-CM | POA: Diagnosis not present

## 2020-03-06 DIAGNOSIS — I10 Essential (primary) hypertension: Secondary | ICD-10-CM

## 2020-03-06 DIAGNOSIS — E118 Type 2 diabetes mellitus with unspecified complications: Secondary | ICD-10-CM | POA: Diagnosis not present

## 2020-03-06 DIAGNOSIS — K449 Diaphragmatic hernia without obstruction or gangrene: Secondary | ICD-10-CM

## 2020-03-06 DIAGNOSIS — E1169 Type 2 diabetes mellitus with other specified complication: Secondary | ICD-10-CM | POA: Diagnosis not present

## 2020-03-06 DIAGNOSIS — N393 Stress incontinence (female) (male): Secondary | ICD-10-CM

## 2020-03-06 LAB — POCT URINALYSIS DIPSTICK
Bilirubin, UA: NEGATIVE
Blood, UA: NEGATIVE
Glucose, UA: NEGATIVE
Ketones, UA: NEGATIVE
Leukocytes, UA: NEGATIVE
Nitrite, UA: NEGATIVE
Protein, UA: NEGATIVE
Spec Grav, UA: 1.01 (ref 1.010–1.025)
Urobilinogen, UA: 0.2 E.U./dL
pH, UA: 5 (ref 5.0–8.0)

## 2020-03-06 MED ORDER — TOLTERODINE TARTRATE 2 MG PO TABS: 2.0000 mg | ORAL_TABLET | Freq: Two times a day (BID) | ORAL | 1 refills | Status: DC

## 2020-03-06 MED ORDER — METFORMIN HCL 500 MG PO TABS: 250.0000 mg | ORAL_TABLET | Freq: Every day | ORAL | 1 refills | Status: DC
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
## 2020-03-06 NOTE — Patient Instructions (Signed)
Check that you had PPV-23 pneumonia vaccine since you turned 66.

## 2020-03-06 NOTE — Progress Notes (Signed)
Date:  03/06/2020   Name:  Pamela Rosario   DOB:  1950/12/12   MRN:  [id number]   Chief Complaint: Annual Exam (Medicare Yearly. Just seen [facility] this morning for AWV. Foot exam. Declined vaccines today.)  Pamela Rosario is a 69 y.o. female who presents today for her Complete Annual Exam. She feels well. She reports exercising - none. She reports she is sleeping well. Breast complaints - none.  Mammogram: 02/2020 DEXA: 05/2019 normal Pap smear: discontinued Colonoscopy: 02/2019  Immunization History  Administered Date(s) Administered  . Fluad Quad(high Dose 65+) 03/03/2019  . Hepatitis B 06/03/1990  . IPV 06/04/1955, 06/03/1966  . Influenza, High Dose Seasonal PF 02/26/2018  . Influenza,inj,Quad PF,6+ Mos 02/25/2017  . Influenza-Unspecified 01/02/2016  . MMR 06/03/1981  . Pneumococcal Conjugate-13 09/28/2018  . Pneumococcal Polysaccharide-23 11/15/2009  . Tdap 06/03/2012  . Varicella 06/03/1969  . Zoster Recombinat (Shingrix) 08/14/2017, 12/22/2017  She declines Covid vaccination at this time - is considering it. Also wants to check her records regarding the PPV-23 that was due after age 34  Hypertension This is a chronic problem. The problem is controlled. Pertinent negatives include no chest pain, headaches, palpitations or shortness of breath. Past treatments include ACE inhibitors. The current treatment provides significant improvement. There are no compliance problems.  There is no history of kidney disease, CAD/MI or CVA.  Diabetes She presents for her follow-up diabetic visit. She has type 2 diabetes mellitus. Her disease course has been stable. Pertinent negatives for hypoglycemia include no dizziness, headaches, nervousness/anxiousness or tremors. Pertinent negatives for diabetes include no chest pain, no fatigue, no polydipsia and no polyuria. Pertinent negatives for diabetic complications include no CVA. Current diabetic treatment includes oral agent (monotherapy)  (metformine). She is compliant with treatment all of the time. Her weight is stable. She is following a generally healthy diet. She monitors blood glucose at home 1-2 x per day. Her breakfast blood glucose is taken between 7-8 am. Her breakfast blood glucose range is generally 130-140 mg/dl. An ACE inhibitor/angiotensin II receptor blocker is being taken. Eye exam is current.  Hyperlipidemia This is a chronic problem. The problem is controlled. Pertinent negatives include no chest pain or shortness of breath. Current antihyperlipidemic treatment includes statins. The current treatment provides significant improvement of lipids.  Gastroesophageal Reflux She complains of heartburn (if she misses several days). She reports no abdominal pain, no chest pain, no coughing or no wheezing. This is a recurrent problem. The problem occurs rarely. Pertinent negatives include no fatigue. She has tried a PPI for the symptoms. The treatment provided significant relief.    Lab Results  Component Value Date   CREATININE 0.90 08/24/2019   BUN 17 08/24/2019   NA 141 08/24/2019   K 4.5 08/24/2019   CL 104 08/24/2019   CO2 24 08/24/2019   Lab Results  Component Value Date   CHOL 156 03/03/2019   HDL 36 (L) 03/03/2019   LDLCALC 87 03/03/2019   TRIG 191 (H) 03/03/2019   CHOLHDL 4.3 03/03/2019   Lab Results  Component Value Date   TSH 1.770 03/03/2019   Lab Results  Component Value Date   HGBA1C 7.3 (H) 08/24/2019   Lab Results  Component Value Date   WBC 7.4 03/03/2019   HGB 13.8 03/03/2019   HCT 43.0 03/03/2019   MCV 84 03/03/2019   PLT 258 03/03/2019   Lab Results  Component Value Date   ALT 23 03/03/2019   AST 16 03/03/2019   ALKPHOS  95 03/03/2019   BILITOT 0.3 03/03/2019     Review of Systems  Constitutional: Negative for chills, fatigue and fever.  HENT: Negative for congestion, hearing loss, tinnitus, trouble swallowing and voice change.   Eyes: Negative for visual disturbance.    Respiratory: Negative for cough, chest tightness, shortness of breath and wheezing.   Cardiovascular: Negative for chest pain, palpitations and leg swelling.  Gastrointestinal: Positive for heartburn (if she misses several days). Negative for abdominal pain, constipation, diarrhea and vomiting.  Endocrine: Negative for polydipsia and polyuria.  Genitourinary: Negative for dysuria, frequency, genital sores, vaginal bleeding and vaginal discharge.  Musculoskeletal: Negative for arthralgias, gait problem and joint swelling.  Skin: Negative for color change and rash.  Neurological: Negative for dizziness, tremors, light-headedness and headaches.  Hematological: Negative for adenopathy. Does not bruise/bleed easily.  Psychiatric/Behavioral: Negative for dysphoric mood and sleep disturbance. The patient is not nervous/anxious.     Patient Active Problem List   Diagnosis Date Noted  . Personal history of colonic polyps   . Polyp of sigmoid colon   . Stress incontinence of urine 02/25/2017  . Allergy desensitization therapy 01/20/2017  . Hiatal hernia with GERD without esophagitis 09/06/2016  . Fibrocystic breast changes of both breasts 09/06/2016  . Intraductal papilloma of breast, left 09/06/2016  . Hyperlipidemia associated with type 2 diabetes mellitus ([place]) 09/06/2016  . Essential hypertension 09/06/2016  . Type II diabetes mellitus with complication ([place]) 98/33/8250  . Thyroid nodule 09/06/2016  . Adenomatous colon polyp 09/06/2016    Allergies  Allergen Reactions  . Demerol [Meperidine] Hives  . Tetracyclines & Related Hives  . Simvastatin Other (See Comments)    Muscle pain    Past Surgical History:  Procedure Laterality Date  . brachial cleft cyst excison ( Right Neck )  1971  . BREAST EXCISIONAL BIOPSY Left    intraductal papilloma  . BREAST SURGERY  2007   L intraductal papilloma excision  . [place]   emergency  . COLONOSCOPY  08/24/2013   tubular  adenoma  . COLONOSCOPY WITH PROPOFOL N/A 02/05/2019   Procedure: COLONOSCOPY WITH BIOPSIES;  Surgeon: Lucilla Lame, MD;  Location: [facility];  Service: Endoscopy;  Laterality: N/A;  Diabetic - oral meds  . ESOPHAGOGASTRODUODENOSCOPY  2005   hiatal hernia & polyp found  . EXCISION / BIOPSY BREAST / NIPPLE / DUCT Left 2005  . LARYNGOSCOPY  2006  . left intraductal papilloma excision of left breast  2007  . POLYPECTOMY N/A 02/05/2019   Procedure: POLYPECTOMY;  Surgeon: Lucilla Lame, MD;  Location: [facility];  Service: Endoscopy;  Laterality: N/A;  . TONSILECTOMY, ADENOIDECTOMY, BILATERAL MYRINGOTOMY AND TUBES      Social History   Tobacco Use  . Smoking status: Never Smoker  . Smokeless tobacco: Never Used  . Tobacco comment: smoking cessation materials not required  Vaping Use  . Vaping Use: Never used  Substance Use Topics  . Alcohol use: No  . Drug use: No     Medication list has been reviewed and updated.  Current Meds  Medication Sig  . atorvastatin (LIPITOR) 20 MG tablet TAKE 1 TABLET (20 MG TOTAL) BY MOUTH DAILY.  . fluticasone (FLONASE) 50 MCG/ACT nasal spray Place 2 sprays into both nostrils daily.  Marland Kitchen glucose blood test strip 1 each by Other route as needed for other. Use as instructed  . levocetirizine (XYZAL) 5 MG tablet Take 5 mg by mouth every evening. PRN  . lisinopril (ZESTRIL) 10  MG tablet TAKE 1 TABLET DAILY  . metFORMIN (GLUCOPHAGE) 500 MG tablet TAKE 1/2 TABLET DAILY  . omeprazole (PRILOSEC) 20 MG capsule Take 1 capsule (20 mg total) by mouth 2 (two) times daily before a meal.  . TOLTERODINE TARTRATE PO Take 1 tablet by mouth in the morning and at bedtime.  Marland Kitchen UNABLE TO FIND Allergy Shots- [facility].  Started- 01/2017    PHQ 2/9 Scores 03/06/2020 08/24/2019 03/01/2019 09/28/2018  PHQ - 2 Score 0 0 0 0  PHQ- 9 Score - 0 - -    GAD 7 : Generalized Anxiety Score 08/24/2019  Nervous, Anxious, on Edge 0  Control/stop worrying 0    Worry too much - different things 0  Trouble relaxing 0  Restless 0  Easily annoyed or irritable 0  Afraid - awful might happen 0  Total GAD 7 Score 0  Anxiety Difficulty Not difficult at all    BP Readings from Last 3 Encounters:  03/06/20 118/68  03/06/20 118/68  08/24/19 130/68    Physical Exam Vitals and nursing note reviewed.  Constitutional:      General: She is not in acute distress.    Appearance: She is well-developed.  HENT:     Head: Normocephalic and atraumatic.     Right Ear: Tympanic membrane and ear canal normal.     Left Ear: Tympanic membrane and ear canal normal.     Nose:     Right Sinus: No maxillary sinus tenderness.     Left Sinus: No maxillary sinus tenderness.  Eyes:     General: No scleral icterus.       Right eye: No discharge.        Left eye: No discharge.     Conjunctiva/sclera: Conjunctivae normal.  Neck:     Thyroid: No thyromegaly.     Vascular: No carotid bruit.  Cardiovascular:     Rate and Rhythm: Normal rate and regular rhythm.     Pulses: Normal pulses.     Heart sounds: Normal heart sounds.  Pulmonary:     Effort: Pulmonary effort is normal. No respiratory distress.     Breath sounds: No wheezing.  Chest:     Breasts:        Right: No mass, nipple discharge, skin change or tenderness.        Left: No mass, nipple discharge, skin change or tenderness.  Abdominal:     General: Abdomen is flat.     Palpations: Abdomen is soft.     Tenderness: There is no abdominal tenderness.  Musculoskeletal:        General: Normal range of motion.     Cervical back: Normal range of motion. No erythema.     Right lower leg: No edema.     Left lower leg: No edema.  Lymphadenopathy:     Cervical: No cervical adenopathy.  Skin:    General: Skin is warm and dry.     Findings: No rash.  Neurological:     General: No focal deficit present.     Mental Status: She is alert and oriented to person, place, and time.     Cranial Nerves: No  cranial nerve deficit.     Sensory: No sensory deficit.     Deep Tendon Reflexes: Reflexes are normal and symmetric.  Psychiatric:        Attention and Perception: Attention normal.        Mood and Affect: Mood normal.  Behavior: Behavior normal.     Wt Readings from Last 3 Encounters:  03/06/20 186 lb (84.4 kg)  03/06/20 186 lb 6.4 oz (84.6 kg)  08/24/19 188 lb (85.3 kg)    BP 118/68   Pulse 90   Ht 5' 2" (1.575 m)   Wt 186 lb (84.4 kg)   BMI 34.02 kg/m   Assessment and Plan: 1. Essential hypertension Clinically stable exam with well controlled BP. Tolerating medications without side effects at this time. Pt to continue current regimen and low sodium diet; benefits of regular exercise as able discussed. - CBC with Differential/Platelet - POCT urinalysis dipstick  2. Type II diabetes mellitus with complication (HCC) Clinically stable by exam and report without s/s of hypoglycemia. DM complicated by HTN. Tolerating medications - low dose metformin -  well without side effects or other concerns. Eye exam up to date Due for PPV-23 (pt will check her records because she thinks she had it) - Comprehensive metabolic panel - Hemoglobin A1c - metFORMIN (GLUCOPHAGE) 500 MG tablet; Take 0.5 tablets (250 mg total) by mouth daily.  Dispense: 45 tablet; Refill: 1  3. Hyperlipidemia associated with type 2 diabetes mellitus ([place]) Tolerating statin medication without side effects at this time LDL is not quite at goal of < 70 on current dose Continue same therapy without change at this time. - Lipid panel  4. Need for vaccination for pneumococcus Pt declines today - will check records at home and call back  5. Thyroid nodule No current thyroid symptoms - TSH + free T4  6. Stress incontinence of urine She recently resumed Detrol for OAB/stress incontinence.  It is working well and she has not had the dry mouth that was so bothersome before - tolterodine (DETROL) 2 MG  tablet; Take 1 tablet (2 mg total) by mouth 2 (two) times daily.  Dispense: 180 tablet; Refill: 1  7. Hiatal hernia with GERD without esophagitis Symptoms well controlled on daily PPI No red flag signs such as weight loss, n/v, melena Will continue omeprazole.    Partially dictated using Editor, commissioning. Any errors are unintentional.  Halina Maidens, MD [facility]  03/06/2020

## 2020-03-06 NOTE — Patient Instructions (Signed)
[name] , Thank you for taking time to come for your Medicare Wellness Visit. I appreciate your ongoing commitment to your health goals. Please review the following plan we discussed and let me know if I can assist you in the future.   Screening recommendations/referrals: Colonoscopy: done 02/05/19. Repeat in 2025 Mammogram: done 02/22/20.  Bone Density: done 05/04/19. Recommended yearly ophthalmology/optometry visit for glaucoma screening and checkup Recommended yearly dental visit for hygiene and checkup  Vaccinations: Influenza vaccine: due Pneumococcal vaccine: done 09/28/18 Tdap vaccine: done 2014 Shingles vaccine: done 08/14/17 & 12/22/17   Covid-19: discussed  Advanced directives: Please bring a copy of your health care power of attorney and living will to the office at your convenience once you have completed those documents.   Conditions/risks identified: Recommend increasing physical activity.   Next appointment: Follow up in one year for your annual wellness visit    Preventive Care 65 Years and Older, Female Preventive care refers to lifestyle choices and visits with your health care provider that can promote health and wellness. What does preventive care include?  A yearly physical exam. This is also called an annual well check.  Dental exams once or twice a year.  Routine eye exams. Ask your health care provider how often you should have your eyes checked.  Personal lifestyle choices, including:  Daily care of your teeth and gums.  Regular physical activity.  Eating a healthy diet.  Avoiding tobacco and drug use.  Limiting alcohol use.  Practicing safe sex.  Taking low-dose aspirin every day.  Taking vitamin and mineral supplements as recommended by your health care provider. What happens during an annual well check? The services and screenings done by your health care provider during your annual well check will depend on your age, overall health,  lifestyle risk factors, and family history of disease. Counseling  Your health care provider may ask you questions about your:  Alcohol use.  Tobacco use.  Drug use.  Emotional well-being.  Home and relationship well-being.  Sexual activity.  Eating habits.  History of falls.  Memory and ability to understand (cognition).  Work and work Statistician.  Reproductive health. Screening  You may have the following tests or measurements:  Height, weight, and BMI.  Blood pressure.  Lipid and cholesterol levels. These may be checked every 5 years, or more frequently if you are over 44 years old.  Skin check.  Lung cancer screening. You may have this screening every year starting at age 45 if you have a 30-pack-year history of smoking and currently smoke or have quit within the past 15 years.  Fecal occult blood test (FOBT) of the stool. You may have this test every year starting at age 59.  Flexible sigmoidoscopy or colonoscopy. You may have a sigmoidoscopy every 5 years or a colonoscopy every 10 years starting at age 58.  Hepatitis C blood test.  Hepatitis B blood test.  Sexually transmitted disease (STD) testing.  Diabetes screening. This is done by checking your blood sugar (glucose) after you have not eaten for a while (fasting). You may have this done every 1-3 years.  Bone density scan. This is done to screen for osteoporosis. You may have this done starting at age 86.  Mammogram. This may be done every 1-2 years. Talk to your health care provider about how often you should have regular mammograms. Talk with your health care provider about your test results, treatment options, and if necessary, the need for more tests. Vaccines  Your health care provider may recommend certain vaccines, such as:  Influenza vaccine. This is recommended every year.  Tetanus, diphtheria, and acellular pertussis (Tdap, Td) vaccine. You may need a Td booster every 10 years.  Zoster  vaccine. You may need this after age 66.  Pneumococcal 13-valent conjugate (PCV13) vaccine. One dose is recommended after age 21.  Pneumococcal polysaccharide (PPSV23) vaccine. One dose is recommended after age 70. Talk to your health care provider about which screenings and vaccines you need and how often you need them. This information is not intended to replace advice given to you by your health care provider. Make sure you discuss any questions you have with your health care provider. Document Released: 06/16/2015 Document Revised: 02/07/2016 Document Reviewed: 03/21/2015 Elsevier Interactive Patient Education  2017 [employer] Prevention in the Home Falls can cause injuries. They can happen to people of all ages. There are many things you can do to make your home safe and to help prevent falls. What can I do on the outside of my home?  Regularly fix the edges of walkways and driveways and fix any cracks.  Remove anything that might make you trip as you walk through a door, such as a raised step or threshold.  Trim any bushes or trees on the path to your home.  Use bright outdoor lighting.  Clear any walking paths of anything that might make someone trip, such as rocks or tools.  Regularly check to see if handrails are loose or broken. Make sure that both sides of any steps have handrails.  Any raised decks and porches should have guardrails on the edges.  Have any leaves, snow, or ice cleared regularly.  Use sand or salt on walking paths during winter.  Clean up any spills in your garage right away. This includes oil or grease spills. What can I do in the bathroom?  Use night lights.  Install grab bars by the toilet and in the tub and shower. Do not use towel bars as grab bars.  Use non-skid mats or decals in the tub or shower.  If you need to sit down in the shower, use a plastic, non-slip stool.  Keep the floor dry. Clean up any water that spills on the  floor as soon as it happens.  Remove soap buildup in the tub or shower regularly.  Attach bath mats securely with double-sided non-slip rug tape.  Do not have throw rugs and other things on the floor that can make you trip. What can I do in the bedroom?  Use night lights.  Make sure that you have a light by your bed that is easy to reach.  Do not use any sheets or blankets that are too big for your bed. They should not hang down onto the floor.  Have a firm chair that has side arms. You can use this for support while you get dressed.  Do not have throw rugs and other things on the floor that can make you trip. What can I do in the kitchen?  Clean up any spills right away.  Avoid walking on wet floors.  Keep items that you use a lot in easy-to-reach places.  If you need to reach something above you, use a strong step stool that has a grab bar.  Keep electrical cords out of the way.  Do not use floor polish or wax that makes floors slippery. If you must use wax, use non-skid floor wax.  Do  not have throw rugs and other things on the floor that can make you trip. What can I do with my stairs?  Do not leave any items on the stairs.  Make sure that there are handrails on both sides of the stairs and use them. Fix handrails that are broken or loose. Make sure that handrails are as long as the stairways.  Check any carpeting to make sure that it is firmly attached to the stairs. Fix any carpet that is loose or worn.  Avoid having throw rugs at the top or bottom of the stairs. If you do have throw rugs, attach them to the floor with carpet tape.  Make sure that you have a light switch at the top of the stairs and the bottom of the stairs. If you do not have them, ask someone to add them for you. What else can I do to help prevent falls?  Wear shoes that:  Do not have high heels.  Have rubber bottoms.  Are comfortable and fit you well.  Are closed at the toe. Do not wear  sandals.  If you use a stepladder:  Make sure that it is fully opened. Do not climb a closed stepladder.  Make sure that both sides of the stepladder are locked into place.  Ask someone to hold it for you, if possible.  Clearly mark and make sure that you can see:  Any grab bars or handrails.  First and last steps.  Where the edge of each step is.  Use tools that help you move around (mobility aids) if they are needed. These include:  Canes.  Walkers.  Scooters.  Crutches.  Turn on the lights when you go into a dark area. Replace any light bulbs as soon as they burn out.  Set up your furniture so you have a clear path. Avoid moving your furniture around.  If any of your floors are uneven, fix them.  If there are any pets around you, be aware of where they are.  Review your medicines with your doctor. Some medicines can make you feel dizzy. This can increase your chance of falling. Ask your doctor what other things that you can do to help prevent falls. This information is not intended to replace advice given to you by your health care provider. Make sure you discuss any questions you have with your health care provider. Document Released: 03/16/2009 Document Revised: 10/26/2015 Document Reviewed: 06/24/2014 Elsevier Interactive Patient Education  2017 [employer].

## 2020-03-07 LAB — COMPREHENSIVE METABOLIC PANEL
ALT: 32 IU/L (ref 0–32)
AST: 20 IU/L (ref 0–40)
Albumin/Globulin Ratio: 1.8 (ref 1.2–2.2)
Albumin: 4.6 g/dL (ref 3.8–4.8)
Alkaline Phosphatase: 92 IU/L (ref 44–121)
BUN/Creatinine Ratio: 25 (ref 12–28)
BUN: 22 mg/dL (ref 8–27)
Bilirubin Total: 0.3 mg/dL (ref 0.0–1.2)
CO2: 22 mmol/L (ref 20–29)
Calcium: 9.8 mg/dL (ref 8.7–10.3)
Chloride: 106 mmol/L (ref 96–106)
Creatinine, Ser: 0.89 mg/dL (ref 0.57–1.00)
GFR calc Af Amer: 77 mL/min/{1.73_m2} (ref >59)
GFR calc non Af Amer: 67 mL/min/{1.73_m2} (ref >59)
Globulin, Total: 2.6 g/dL (ref 1.5–4.5)
Glucose: 92 mg/dL (ref 65–99)
Potassium: 4.6 mmol/L (ref 3.5–5.2)
Sodium: 142 mmol/L (ref 134–144)
Total Protein: 7.2 g/dL (ref 6.0–8.5)

## 2020-03-07 LAB — LIPID PANEL
Chol/HDL Ratio: 4.6 ratio — ABNORMAL HIGH (ref 0.0–4.4)
Cholesterol, Total: 167 mg/dL (ref 100–199)
HDL: 36 mg/dL — ABNORMAL LOW (ref >39)
LDL Chol Calc (NIH): 93 mg/dL (ref 0–99)
Triglycerides: 220 mg/dL — ABNORMAL HIGH (ref 0–149)
VLDL Cholesterol Cal: 38 mg/dL (ref 5–40)

## 2020-03-07 LAB — CBC WITH DIFFERENTIAL/PLATELET
Basophils Absolute: 0 10*3/uL (ref 0.0–0.2)
Basos: 0 %
EOS (ABSOLUTE): 0.2 10*3/uL (ref 0.0–0.4)
Eos: 2 %
Hematocrit: 41.5 % (ref 34.0–46.6)
Hemoglobin: 13.5 g/dL (ref 11.1–15.9)
Immature Grans (Abs): 0 10*3/uL (ref 0.0–0.1)
Immature Granulocytes: 0 %
Lymphocytes Absolute: 3.2 10*3/uL — ABNORMAL HIGH (ref 0.7–3.1)
Lymphs: 40 %
MCH: 27.6 pg (ref 26.6–33.0)
MCHC: 32.5 g/dL (ref 31.5–35.7)
MCV: 85 fL (ref 79–97)
Monocytes Absolute: 0.7 10*3/uL (ref 0.1–0.9)
Monocytes: 9 %
Neutrophils Absolute: 4 10*3/uL (ref 1.4–7.0)
Neutrophils: 49 %
Platelets: 228 10*3/uL (ref 150–450)
RBC: 4.9 x10E6/uL (ref 3.77–5.28)
RDW: 14.1 % (ref 11.7–15.4)
WBC: 8.1 10*3/uL (ref 3.4–10.8)

## 2020-03-07 LAB — TSH+FREE T4
Free T4: 1.22 ng/dL (ref 0.82–1.77)
TSH: 2.05 u[IU]/mL (ref 0.450–4.500)

## 2020-03-07 LAB — HEMOGLOBIN A1C
Est. average glucose Bld gHb Est-mCnc: 154 mg/dL
Hgb A1c MFr Bld: 7 % — ABNORMAL HIGH (ref 4.8–5.6)

## 2020-04-25 DIAGNOSIS — J301 Allergic rhinitis due to pollen: Secondary | ICD-10-CM | POA: Diagnosis not present

## 2020-05-02 DIAGNOSIS — J301 Allergic rhinitis due to pollen: Secondary | ICD-10-CM | POA: Diagnosis not present

## 2020-07-06 ENCOUNTER — Other Ambulatory Visit: Payer: Self-pay | Admitting: Internal Medicine

## 2020-07-12 DIAGNOSIS — L578 Other skin changes due to chronic exposure to nonionizing radiation: Secondary | ICD-10-CM | POA: Diagnosis not present

## 2020-07-12 DIAGNOSIS — D485 Neoplasm of uncertain behavior of skin: Secondary | ICD-10-CM | POA: Diagnosis not present

## 2020-07-12 DIAGNOSIS — D1801 Hemangioma of skin and subcutaneous tissue: Secondary | ICD-10-CM | POA: Diagnosis not present

## 2020-07-12 DIAGNOSIS — D2371 Other benign neoplasm of skin of right lower limb, including hip: Secondary | ICD-10-CM | POA: Diagnosis not present

## 2020-07-19 DIAGNOSIS — J301 Allergic rhinitis due to pollen: Secondary | ICD-10-CM | POA: Diagnosis not present

## 2020-07-28 DIAGNOSIS — J301 Allergic rhinitis due to pollen: Secondary | ICD-10-CM | POA: Diagnosis not present

## 2020-08-01 DIAGNOSIS — D2271 Melanocytic nevi of right lower limb, including hip: Secondary | ICD-10-CM | POA: Diagnosis not present

## 2020-08-01 DIAGNOSIS — D2371 Other benign neoplasm of skin of right lower limb, including hip: Secondary | ICD-10-CM | POA: Diagnosis not present

## 2020-08-20 ENCOUNTER — Other Ambulatory Visit: Payer: Self-pay | Admitting: Internal Medicine

## 2020-08-20 DIAGNOSIS — N393 Stress incontinence (female) (male): Secondary | ICD-10-CM

## 2020-08-21 NOTE — Telephone Encounter (Signed)
Patient will call back to reschedule.

## 2020-08-28 ENCOUNTER — Ambulatory Visit (INDEPENDENT_AMBULATORY_CARE_PROVIDER_SITE_OTHER): Payer: Medicare Other | Admitting: Internal Medicine

## 2020-08-28 ENCOUNTER — Encounter: Payer: Self-pay | Admitting: Internal Medicine

## 2020-08-28 ENCOUNTER — Other Ambulatory Visit: Payer: Self-pay

## 2020-08-28 VITALS — BP 120/68 | HR 81 | Temp 97.5°F | Ht 62.0 in | Wt 184.0 lb

## 2020-08-28 DIAGNOSIS — K449 Diaphragmatic hernia without obstruction or gangrene: Secondary | ICD-10-CM

## 2020-08-28 DIAGNOSIS — K219 Gastro-esophageal reflux disease without esophagitis: Secondary | ICD-10-CM | POA: Diagnosis not present

## 2020-08-28 DIAGNOSIS — E118 Type 2 diabetes mellitus with unspecified complications: Secondary | ICD-10-CM

## 2020-08-28 DIAGNOSIS — I10 Essential (primary) hypertension: Secondary | ICD-10-CM

## 2020-08-28 LAB — POCT GLYCOSYLATED HEMOGLOBIN (HGB A1C): Hemoglobin A1C: 7 % — AB (ref 4.0–5.6)

## 2020-08-28 MED ORDER — METFORMIN HCL 500 MG PO TABS: 250.0000 mg | ORAL_TABLET | Freq: Every day | ORAL | 1 refills | Status: DC

## 2020-08-28 MED ORDER — LISINOPRIL 10 MG PO TABS: 10.0000 mg | ORAL_TABLET | Freq: Every day | ORAL | 1 refills | Status: DC

## 2020-08-28 NOTE — Progress Notes (Signed)
Date:  08/28/2020   Name:  Pamela Rosario   DOB:  1950-11-05   MRN:  [id number]   Chief Complaint: Diabetes and Hypertension  Diabetes She presents for her follow-up diabetic visit. She has type 2 diabetes mellitus. Her disease course has been stable. Pertinent negatives for hypoglycemia include no headaches or tremors. Pertinent negatives for diabetes include no chest pain, no fatigue, no polydipsia and no polyuria. There are no hypoglycemic complications. Symptoms are stable. Pertinent negatives for diabetic complications include no CVA. Current diabetic treatment includes oral agent (monotherapy). She is compliant with treatment all of the time. Her weight is stable. There is no compliance with monitoring of blood glucose. An ACE inhibitor/angiotensin II receptor blocker is being taken. Eye exam is current.  Hypertension This is a chronic problem. The problem is controlled. Pertinent negatives include no chest pain, headaches, palpitations or shortness of breath. Past treatments include ACE inhibitors. The current treatment provides significant improvement. There is no history of kidney disease, CAD/MI or CVA.  Gastroesophageal Reflux She complains of globus sensation and heartburn. She reports no abdominal pain, no chest pain or no coughing. This is a recurrent problem. The problem occurs rarely. Pertinent negatives include no fatigue. She has tried a PPI for the symptoms. The treatment provided significant (able to take once a day with good control but can not taper to less often without sx) relief.    Lab Results  Component Value Date   CREATININE 0.89 03/06/2020   BUN 22 03/06/2020   NA 142 03/06/2020   K 4.6 03/06/2020   CL 106 03/06/2020   CO2 22 03/06/2020   Lab Results  Component Value Date   CHOL 167 03/06/2020   HDL 36 (L) 03/06/2020   LDLCALC 93 03/06/2020   TRIG 220 (H) 03/06/2020   CHOLHDL 4.6 (H) 03/06/2020   Lab Results  Component Value Date   TSH 2.050  03/06/2020   Lab Results  Component Value Date   HGBA1C 7.0 (H) 03/06/2020   Lab Results  Component Value Date   WBC 8.1 03/06/2020   HGB 13.5 03/06/2020   HCT 41.5 03/06/2020   MCV 85 03/06/2020   PLT 228 03/06/2020   Lab Results  Component Value Date   ALT 32 03/06/2020   AST 20 03/06/2020   ALKPHOS 92 03/06/2020   BILITOT 0.3 03/06/2020     Review of Systems  Constitutional: Negative for appetite change, fatigue, fever and unexpected weight change.  Eyes: Negative for visual disturbance.  Respiratory: Negative for cough, chest tightness and shortness of breath.   Cardiovascular: Negative for chest pain, palpitations and leg swelling.  Gastrointestinal: Positive for heartburn. Negative for abdominal pain.       Recurrent globus if she skips PPI for several days   Endocrine: Negative for polydipsia and polyuria.  Genitourinary: Negative for dysuria and hematuria.  Musculoskeletal: Negative for arthralgias.  Neurological: Negative for tremors, numbness and headaches.  Psychiatric/Behavioral: Negative for dysphoric mood.    Patient Active Problem List   Diagnosis Date Noted  . Personal history of colonic polyps   . Polyp of sigmoid colon   . Stress incontinence of urine 02/25/2017  . Allergy desensitization therapy 01/20/2017  . Hiatal hernia with GERD without esophagitis 09/06/2016  . Fibrocystic breast changes of both breasts 09/06/2016  . Intraductal papilloma of breast, left 09/06/2016  . Hyperlipidemia associated with type 2 diabetes mellitus ([place]) 09/06/2016  . Essential hypertension 09/06/2016  . Type II diabetes mellitus with complication (  [place]) 09/06/2016  . Thyroid nodule 09/06/2016  . Adenomatous colon polyp 09/06/2016    Allergies  Allergen Reactions  . Demerol [Meperidine] Hives  . Tetracyclines & Related Hives  . Simvastatin Other (See Comments)    Muscle pain    Past Surgical History:  Procedure Laterality Date  . brachial cleft cyst  excison ( Right Neck )  1971  . BREAST EXCISIONAL BIOPSY Left    intraductal papilloma  . BREAST SURGERY  2007   L intraductal papilloma excision  . [place]   emergency  . COLONOSCOPY  08/24/2013   tubular adenoma  . COLONOSCOPY WITH PROPOFOL N/A 02/05/2019   Procedure: COLONOSCOPY WITH BIOPSIES;  Surgeon: Lucilla Lame, MD;  Location: [facility];  Service: Endoscopy;  Laterality: N/A;  Diabetic - oral meds  . ESOPHAGOGASTRODUODENOSCOPY  2005   hiatal hernia & polyp found  . EXCISION / BIOPSY BREAST / NIPPLE / DUCT Left 2005  . LARYNGOSCOPY  2006  . left intraductal papilloma excision of left breast  2007  . POLYPECTOMY N/A 02/05/2019   Procedure: POLYPECTOMY;  Surgeon: Lucilla Lame, MD;  Location: [facility];  Service: Endoscopy;  Laterality: N/A;  . TONSILECTOMY, ADENOIDECTOMY, BILATERAL MYRINGOTOMY AND TUBES      Social History   Tobacco Use  . Smoking status: Never Smoker  . Smokeless tobacco: Never Used  . Tobacco comment: smoking cessation materials not required  Vaping Use  . Vaping Use: Never used  Substance Use Topics  . Alcohol use: No  . Drug use: No     Medication list has been reviewed and updated.  Current Meds  Medication Sig  . atorvastatin (LIPITOR) 20 MG tablet TAKE 1 TABLET (20 MG TOTAL) BY MOUTH DAILY.  . fluticasone (FLONASE) 50 MCG/ACT nasal spray Place 2 sprays into both nostrils daily.  Marland Kitchen glucose blood test strip 1 each by Other route as needed for other. Use as instructed  . levocetirizine (XYZAL) 5 MG tablet Take 5 mg by mouth every evening. PRN  . lisinopril (ZESTRIL) 10 MG tablet TAKE 1 TABLET DAILY  . metFORMIN (GLUCOPHAGE) 500 MG tablet Take 0.5 tablets (250 mg total) by mouth daily.  Marland Kitchen omeprazole (PRILOSEC) 20 MG capsule Take 1 capsule (20 mg total) by mouth 2 (two) times daily before a meal. (Patient taking differently: Take 20 mg by mouth daily.)  . tolterodine (DETROL) 2 MG tablet TAKE 1 TABLET TWICE A DAY  .  UNABLE TO FIND Allergy Shots- [facility].  Started- 01/2017    PHQ 2/9 Scores 08/28/2020 03/06/2020 08/24/2019 03/01/2019  PHQ - 2 Score 0 0 0 0  PHQ- 9 Score 0 - 0 -    GAD 7 : Generalized Anxiety Score 08/28/2020 08/24/2019  Nervous, Anxious, on Edge 0 0  Control/stop worrying 0 0  Worry too much - different things 0 0  Trouble relaxing 0 0  Restless 0 0  Easily annoyed or irritable 0 0  Afraid - awful might happen 0 0  Total GAD 7 Score 0 0  Anxiety Difficulty Not difficult at all Not difficult at all    BP Readings from Last 3 Encounters:  08/28/20 120/68  03/06/20 118/68  03/06/20 118/68    Physical Exam Vitals and nursing note reviewed.  Constitutional:      General: She is not in acute distress.    Appearance: Normal appearance. She is well-developed.  HENT:     Head: Normocephalic and atraumatic.  Neck:  Vascular: No carotid bruit.  Cardiovascular:     Rate and Rhythm: Normal rate and regular rhythm.  Pulmonary:     Effort: Pulmonary effort is normal. No respiratory distress.     Breath sounds: No wheezing or rhonchi.  Musculoskeletal:     Cervical back: Normal range of motion.     Right lower leg: No edema.     Left lower leg: No edema.  Lymphadenopathy:     Cervical: No cervical adenopathy.  Skin:    General: Skin is warm and dry.     Capillary Refill: Capillary refill takes less than 2 seconds.     Findings: No rash.  Neurological:     Mental Status: She is alert and oriented to person, place, and time.  Psychiatric:        Mood and Affect: Mood normal.        Behavior: Behavior normal.     Wt Readings from Last 3 Encounters:  08/28/20 184 lb (83.5 kg)  03/06/20 186 lb (84.4 kg)  03/06/20 186 lb 6.4 oz (84.6 kg)    BP 120/68   Pulse 81   Temp (!) 97.5 F (36.4 C) (Oral)   Ht 5\' 2"  (1.575 m)   Wt 184 lb (83.5 kg)   SpO2 96%   BMI 33.65 kg/m   Assessment and Plan: 1. Type II diabetes mellitus with complication  (HCC) Clinically stable by exam and report without s/s of hypoglycemia. P4D = 7.0 DM complicated by HTN. Tolerating medications- metformin 250 mg -  well without side effects or other concerns. - metFORMIN (GLUCOPHAGE) 500 MG tablet; Take 0.5 tablets (250 mg total) by mouth daily.  Dispense: 45 tablet; Refill: 1  2. Essential hypertension Clinically stable exam with well controlled BP. Tolerating medications without side effects at this time. Pt to continue current regimen and low sodium diet; benefits of regular exercise as able discussed. - lisinopril (ZESTRIL) 10 MG tablet; Take 1 tablet (10 mg total) by mouth daily.  Dispense: 90 tablet; Refill: 1  3. Hiatal hernia with GERD without esophagitis Symptoms well controlled on daily PPI No red flag signs such as weight loss, n/v, melena Will continue omeprazole once a day without further taper at this time.    Partially dictated using Editor, commissioning. Any errors are unintentional.  Halina Maidens, MD [facility]  08/28/2020

## 2020-09-06 ENCOUNTER — Other Ambulatory Visit: Payer: Self-pay | Admitting: Internal Medicine

## 2020-09-06 DIAGNOSIS — K219 Gastro-esophageal reflux disease without esophagitis: Secondary | ICD-10-CM

## 2020-09-06 NOTE — Telephone Encounter (Signed)
Requested medications are due for refill today yes  Requested medications are on the active medication list yes, questionable dose  Last refill 12/30  Last visit 08/28/20  Future visit scheduled 03/2021  Notes to clinic Dr Ulyses Jarred note states to take once daily (pt was only taking once daily even though previous rx was for twice a day. Please assess.

## 2020-10-04 ENCOUNTER — Other Ambulatory Visit: Payer: Self-pay | Admitting: Internal Medicine

## 2020-10-04 DIAGNOSIS — I10 Essential (primary) hypertension: Secondary | ICD-10-CM

## 2020-10-04 DIAGNOSIS — J301 Allergic rhinitis due to pollen: Secondary | ICD-10-CM | POA: Diagnosis not present

## 2020-10-04 DIAGNOSIS — E118 Type 2 diabetes mellitus with unspecified complications: Secondary | ICD-10-CM

## 2020-10-09 DIAGNOSIS — J301 Allergic rhinitis due to pollen: Secondary | ICD-10-CM | POA: Diagnosis not present

## 2020-10-19 DIAGNOSIS — H2511 Age-related nuclear cataract, right eye: Secondary | ICD-10-CM | POA: Diagnosis not present

## 2020-10-19 LAB — HM DIABETES EYE EXAM

## 2020-10-23 ENCOUNTER — Encounter: Payer: Self-pay | Admitting: Internal Medicine

## 2020-11-18 ENCOUNTER — Other Ambulatory Visit: Payer: Self-pay | Admitting: Internal Medicine

## 2020-11-18 DIAGNOSIS — N393 Stress incontinence (female) (male): Secondary | ICD-10-CM

## 2020-11-18 NOTE — Telephone Encounter (Signed)
Requested Prescriptions  Pending Prescriptions Disp Refills  . tolterodine (DETROL) 2 MG tablet [Pharmacy Med Name: TOLTERODINE  TAB 2MG ] 180 tablet 0    Sig: TAKE 1 TABLET TWICE A DAY     Urology:  Bladder Agents Passed - 11/18/2020  8:08 AM      Passed - Valid encounter within last 12 months    Recent Outpatient Visits          2 months ago Type II diabetes mellitus with complication [facility])   [facility] Glean Hess, MD   8 months ago Essential hypertension   [facility] Glean Hess, MD   1 year ago Essential hypertension   [facility] Glean Hess, MD   1 year ago Essential hypertension   [facility] Glean Hess, MD   2 years ago Type II diabetes mellitus with complication [facility])   [facility] Glean Hess, MD      Future Appointments            In 3 months [facility] Melia Jesse Sans, MD [facility]

## 2020-12-20 DIAGNOSIS — J301 Allergic rhinitis due to pollen: Secondary | ICD-10-CM | POA: Diagnosis not present

## 2020-12-25 DIAGNOSIS — J301 Allergic rhinitis due to pollen: Secondary | ICD-10-CM | POA: Diagnosis not present

## 2020-12-27 DIAGNOSIS — J301 Allergic rhinitis due to pollen: Secondary | ICD-10-CM | POA: Diagnosis not present

## 2020-12-29 ENCOUNTER — Other Ambulatory Visit: Payer: Self-pay | Admitting: Internal Medicine

## 2020-12-29 DIAGNOSIS — I10 Essential (primary) hypertension: Secondary | ICD-10-CM

## 2021-01-09 ENCOUNTER — Other Ambulatory Visit: Payer: Self-pay | Admitting: Internal Medicine

## 2021-01-09 DIAGNOSIS — Z1231 Encounter for screening mammogram for malignant neoplasm of breast: Secondary | ICD-10-CM

## 2021-02-13 ENCOUNTER — Telehealth: Payer: Self-pay | Admitting: Internal Medicine

## 2021-02-13 NOTE — Telephone Encounter (Signed)
Copied from [place] [id number]. Topic: Medicare AWV >> Feb 13, 2021  2:26 PM Weston Anna wrote: Reason for CRM: Need to reschedule AWVS scheduled on Mar 07, 2021 to another day due to [facility] has meeting to attend. [facility]-Nurse Health Advisor

## 2021-02-16 ENCOUNTER — Other Ambulatory Visit: Payer: Self-pay | Admitting: Internal Medicine

## 2021-02-16 DIAGNOSIS — N393 Stress incontinence (female) (male): Secondary | ICD-10-CM

## 2021-02-21 ENCOUNTER — Other Ambulatory Visit: Payer: Self-pay | Admitting: Internal Medicine

## 2021-02-22 ENCOUNTER — Other Ambulatory Visit: Payer: Self-pay

## 2021-02-22 ENCOUNTER — Ambulatory Visit
Admission: RE | Admit: 2021-02-22 | Discharge: 2021-02-22 | Disposition: A | Discharge status: Home / Self Care | Payer: Medicare Other | Source: Ambulatory Visit | Attending: Internal Medicine | Admitting: Internal Medicine

## 2021-02-22 DIAGNOSIS — Z1231 Encounter for screening mammogram for malignant neoplasm of breast: Secondary | ICD-10-CM | POA: Diagnosis not present

## 2021-03-07 ENCOUNTER — Ambulatory Visit: Payer: Medicare Other

## 2021-03-08 ENCOUNTER — Encounter: Payer: Medicare Other | Admitting: Internal Medicine

## 2021-03-21 ENCOUNTER — Ambulatory Visit (INDEPENDENT_AMBULATORY_CARE_PROVIDER_SITE_OTHER): Payer: Medicare Other

## 2021-03-21 DIAGNOSIS — Z0000 Encounter for general adult medical examination without abnormal findings: Secondary | ICD-10-CM

## 2021-03-21 DIAGNOSIS — J301 Allergic rhinitis due to pollen: Secondary | ICD-10-CM | POA: Diagnosis not present
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
## 2021-03-21 NOTE — Patient Instructions (Signed)
Pamela Rosario , Thank you for taking time to come for your Medicare Wellness Visit. I appreciate your ongoing commitment to your health goals. Please review the following plan we discussed and let me know if I can assist you in the future.   Screening recommendations/referrals: Colonoscopy: done 02/05/19. Repeat 02/2024 Mammogram: done 02/22/21 Bone Density: done 05/04/19 Recommended yearly ophthalmology/optometry visit for glaucoma screening and checkup Recommended yearly dental visit for hygiene and checkup  Vaccinations: Influenza vaccine: due Pneumococcal vaccine: done 09/28/18 Tdap vaccine: done 2014 Shingles vaccine: done 08/14/17 & 12/22/17   Covid-19: declined  Advanced directives: Please bring a copy of your health care power of attorney and living will to the office at your convenience.   Conditions/risks identified: Recommend increasing physical activity to at least 3 days per week   Next appointment: Follow up in one year for your annual wellness visit    Preventive Care 65 Years and Older, Female Preventive care refers to lifestyle choices and visits with your health care provider that can promote health and wellness. What does preventive care include? A yearly physical exam. This is also called an annual well check. Dental exams once or twice a year. Routine eye exams. Ask your health care provider how often you should have your eyes checked. Personal lifestyle choices, including: Daily care of your teeth and gums. Regular physical activity. Eating a healthy diet. Avoiding tobacco and drug use. Limiting alcohol use. Practicing safe sex. Taking low-dose aspirin every day. Taking vitamin and mineral supplements as recommended by your health care provider. What happens during an annual well check? The services and screenings done by your health care provider during your annual well check will depend on your age, overall health, lifestyle risk factors, and family history of  disease. Counseling  Your health care provider may ask you questions about your: Alcohol use. Tobacco use. Drug use. Emotional well-being. Home and relationship well-being. Sexual activity. Eating habits. History of falls. Memory and ability to understand (cognition). Work and work Statistician. Reproductive health. Screening  You may have the following tests or measurements: Height, weight, and BMI. Blood pressure. Lipid and cholesterol levels. These may be checked every 5 years, or more frequently if you are over 55 years old. Skin check. Lung cancer screening. You may have this screening every year starting at age 46 if you have a 30-pack-year history of smoking and currently smoke or have quit within the past 15 years. Fecal occult blood test (FOBT) of the stool. You may have this test every year starting at age 96. Flexible sigmoidoscopy or colonoscopy. You may have a sigmoidoscopy every 5 years or a colonoscopy every 10 years starting at age 70. Hepatitis C blood test. Hepatitis B blood test. Sexually transmitted disease (STD) testing. Diabetes screening. This is done by checking your blood sugar (glucose) after you have not eaten for a while (fasting). You may have this done every 1-3 years. Bone density scan. This is done to screen for osteoporosis. You may have this done starting at age 61. Mammogram. This may be done every 1-2 years. Talk to your health care provider about how often you should have regular mammograms. Talk with your health care provider about your test results, treatment options, and if necessary, the need for more tests. Vaccines  Your health care provider may recommend certain vaccines, such as: Influenza vaccine. This is recommended every year. Tetanus, diphtheria, and acellular pertussis (Tdap, Td) vaccine. You may need a Td booster every 10 years. Zoster vaccine.  You may need this after age 44. Pneumococcal 13-valent conjugate (PCV13) vaccine. One  dose is recommended after age 56. Pneumococcal polysaccharide (PPSV23) vaccine. One dose is recommended after age 12. Talk to your health care provider about which screenings and vaccines you need and how often you need them. This information is not intended to replace advice given to you by your health care provider. Make sure you discuss any questions you have with your health care provider. Document Released: 06/16/2015 Document Revised: 02/07/2016 Document Reviewed: 03/21/2015 Elsevier Interactive Patient Education  2017 [place] Prevention in the Home Falls can cause injuries. They can happen to people of all ages. There are many things you can do to make your home safe and to help prevent falls. What can I do on the outside of my home? Regularly fix the edges of walkways and driveways and fix any cracks. Remove anything that might make you trip as you walk through a door, such as a raised step or threshold. Trim any bushes or trees on the path to your home. Use bright outdoor lighting. Clear any walking paths of anything that might make someone trip, such as rocks or tools. Regularly check to see if handrails are loose or broken. Make sure that both sides of any steps have handrails. Any raised decks and porches should have guardrails on the edges. Have any leaves, snow, or ice cleared regularly. Use sand or salt on walking paths during winter. Clean up any spills in your garage right away. This includes oil or grease spills. What can I do in the bathroom? Use night lights. Install grab bars by the toilet and in the tub and shower. Do not use towel bars as grab bars. Use non-skid mats or decals in the tub or shower. If you need to sit down in the shower, use a plastic, non-slip stool. Keep the floor dry. Clean up any water that spills on the floor as soon as it happens. Remove soap buildup in the tub or shower regularly. Attach bath mats securely with double-sided  non-slip rug tape. Do not have throw rugs and other things on the floor that can make you trip. What can I do in the bedroom? Use night lights. Make sure that you have a light by your bed that is easy to reach. Do not use any sheets or blankets that are too big for your bed. They should not hang down onto the floor. Have a firm chair that has side arms. You can use this for support while you get dressed. Do not have throw rugs and other things on the floor that can make you trip. What can I do in the kitchen? Clean up any spills right away. Avoid walking on wet floors. Keep items that you use a lot in easy-to-reach places. If you need to reach something above you, use a strong step stool that has a grab bar. Keep electrical cords out of the way. Do not use floor polish or wax that makes floors slippery. If you must use wax, use non-skid floor wax. Do not have throw rugs and other things on the floor that can make you trip. What can I do with my stairs? Do not leave any items on the stairs. Make sure that there are handrails on both sides of the stairs and use them. Fix handrails that are broken or loose. Make sure that handrails are as long as the stairways. Check any carpeting to make sure that it is  firmly attached to the stairs. Fix any carpet that is loose or worn. Avoid having throw rugs at the top or bottom of the stairs. If you do have throw rugs, attach them to the floor with carpet tape. Make sure that you have a light switch at the top of the stairs and the bottom of the stairs. If you do not have them, ask someone to add them for you. What else can I do to help prevent falls? Wear shoes that: Do not have high heels. Have rubber bottoms. Are comfortable and fit you well. Are closed at the toe. Do not wear sandals. If you use a stepladder: Make sure that it is fully opened. Do not climb a closed stepladder. Make sure that both sides of the stepladder are locked into place. Ask  someone to hold it for you, if possible. Clearly mark and make sure that you can see: Any grab bars or handrails. First and last steps. Where the edge of each step is. Use tools that help you move around (mobility aids) if they are needed. These include: Canes. Walkers. Scooters. Crutches. Turn on the lights when you go into a dark area. Replace any light bulbs as soon as they burn out. Set up your furniture so you have a clear path. Avoid moving your furniture around. If any of your floors are uneven, fix them. If there are any pets around you, be aware of where they are. Review your medicines with your doctor. Some medicines can make you feel dizzy. This can increase your chance of falling. Ask your doctor what other things that you can do to help prevent falls. This information is not intended to replace advice given to you by your health care provider. Make sure you discuss any questions you have with your health care provider. Document Released: 03/16/2009 Document Revised: 10/26/2015 Document Reviewed: 06/24/2014 Elsevier Interactive Patient Education  2017 [employer].

## 2021-03-22 ENCOUNTER — Encounter: Payer: Self-pay | Admitting: Internal Medicine

## 2021-03-22 ENCOUNTER — Ambulatory Visit (INDEPENDENT_AMBULATORY_CARE_PROVIDER_SITE_OTHER): Payer: Medicare Other | Admitting: Internal Medicine

## 2021-03-22 ENCOUNTER — Other Ambulatory Visit: Payer: Self-pay

## 2021-03-22 VITALS — BP 135/84 | HR 81 | Ht 62.0 in | Wt 182.4 lb

## 2021-03-22 DIAGNOSIS — E1169 Type 2 diabetes mellitus with other specified complication: Secondary | ICD-10-CM | POA: Diagnosis not present

## 2021-03-22 DIAGNOSIS — E785 Hyperlipidemia, unspecified: Secondary | ICD-10-CM

## 2021-03-22 DIAGNOSIS — E118 Type 2 diabetes mellitus with unspecified complications: Secondary | ICD-10-CM

## 2021-03-22 DIAGNOSIS — I10 Essential (primary) hypertension: Secondary | ICD-10-CM

## 2021-03-22 DIAGNOSIS — E041 Nontoxic single thyroid nodule: Secondary | ICD-10-CM

## 2021-03-22 LAB — POCT URINALYSIS DIPSTICK
Bilirubin, UA: NEGATIVE
Blood, UA: NEGATIVE
Glucose, UA: NEGATIVE
Ketones, UA: NEGATIVE
Nitrite, UA: NEGATIVE
Protein, UA: POSITIVE — AB
Spec Grav, UA: 1.015 (ref 1.010–1.025)
Urobilinogen, UA: 0.2 E.U./dL
pH, UA: 6 (ref 5.0–8.0)

## 2021-03-22 LAB — POCT UA - MICROALBUMIN: Microalbumin Ur, POC: 50 mg/L

## 2021-03-22 MED ORDER — METFORMIN HCL 500 MG PO TABS: ORAL_TABLET | ORAL | 1 refills | Status: DC

## 2021-03-22 MED ORDER — LISINOPRIL 10 MG PO TABS: 10.0000 mg | ORAL_TABLET | Freq: Every day | ORAL | 1 refills | Status: DC

## 2021-03-22 MED ORDER — ATORVASTATIN CALCIUM 20 MG PO TABS: 20.0000 mg | ORAL_TABLET | Freq: Every day | ORAL | 0 refills | Status: DC

## 2021-03-22 NOTE — Progress Notes (Signed)
Date:  03/22/2021   Name:  Pamela Rosario   DOB:  [birth year]   MRN:  [id number]   Chief Complaint: Annual Exam Pamela Rosario is a 70 y.o. female who presents today for her Complete Annual Exam. She feels well. She reports exercising - none. She reports she is sleeping well. Breast complaints - none.  Mammogram: 02/2021 DEXA: 05/2019 Normal Pap smear: discontinued Colonoscopy: 02/2019  Immunization History  Administered Date(s) Administered   Fluad Quad(high Dose 65+) 03/03/2019   Hepatitis B 06/03/1990   IPV 06/04/1955, 06/03/1966   Influenza, High Dose Seasonal PF 02/26/2018   Influenza,inj,Quad PF,6+ Mos 02/25/2017   Influenza-Unspecified 01/02/2016   MMR 06/03/1981   Pneumococcal Conjugate-13 09/28/2018   Pneumococcal Polysaccharide-23 11/15/2009   Tdap 06/03/2012   Varicella 06/03/1969   Zoster Recombinat (Shingrix) 08/14/2017, 12/22/2017    Hypertension This is a chronic problem. The problem is controlled. Pertinent negatives include no chest pain, headaches, palpitations or shortness of breath. Past treatments include ACE inhibitors. The current treatment provides significant improvement. There is no history of kidney disease, CAD/MI or CVA. Identifiable causes of hypertension include a thyroid problem.  Diabetes She presents for her follow-up diabetic visit. She has type 2 diabetes mellitus. Her disease course has been stable. Pertinent negatives for hypoglycemia include no dizziness, headaches, nervousness/anxiousness or tremors. Pertinent negatives for diabetes include no chest pain, no fatigue, no polydipsia and no polyuria. Pertinent negatives for diabetic complications include no CVA. Risk factors for coronary artery disease include dyslipidemia and diabetes mellitus. Current diabetic treatment includes oral agent (monotherapy). She is compliant with treatment all of the time. Her weight is stable. Her overall blood glucose range is 110-130 mg/dl. An ACE  inhibitor/angiotensin II receptor blocker is being taken. Eye exam is current.  Hyperlipidemia This is a chronic problem. The problem is controlled. Pertinent negatives include no chest pain or shortness of breath. Current antihyperlipidemic treatment includes statins. The current treatment provides moderate improvement of lipids.  Thyroid Problem Presents for follow-up visit. Patient reports no anxiety, constipation, diarrhea, fatigue, palpitations or tremors. The symptoms have been stable. Her past medical history is significant for hyperlipidemia.  Thyroid US 08/2019: IMPRESSION: Normal-sized thyroid with mildly heterogenous parenchyma. No worrisome nodules or other indication for biopsy or dedicated imaging follow-up.  Lab Results  Component Value Date   CREATININE 0.89 03/06/2020   BUN 22 03/06/2020   NA 142 03/06/2020   K 4.6 03/06/2020   CL 106 03/06/2020   CO2 22 03/06/2020   Lab Results  Component Value Date   CHOL 167 03/06/2020   HDL 36 (L) 03/06/2020   LDLCALC 93 03/06/2020   TRIG 220 (H) 03/06/2020   CHOLHDL 4.6 (H) 03/06/2020   Lab Results  Component Value Date   TSH 2.050 03/06/2020   Lab Results  Component Value Date   HGBA1C 7.0 (A) 08/28/2020   Lab Results  Component Value Date   WBC 8.1 03/06/2020   HGB 13.5 03/06/2020   HCT 41.5 03/06/2020   MCV 85 03/06/2020   PLT 228 03/06/2020   Lab Results  Component Value Date   ALT 32 03/06/2020   AST 20 03/06/2020   ALKPHOS 92 03/06/2020   BILITOT 0.3 03/06/2020     Review of Systems  Constitutional:  Negative for chills, fatigue and fever.  HENT:  Negative for congestion, hearing loss, tinnitus, trouble swallowing and voice change.   Eyes:  Negative for visual disturbance.  Respiratory:  Negative for cough, chest tightness, shortness of  breath and wheezing.   Cardiovascular:  Negative for chest pain, palpitations and leg swelling.  Gastrointestinal:  Negative for abdominal pain, constipation,  diarrhea and vomiting.  Endocrine: Negative for polydipsia and polyuria.  Genitourinary:  Negative for dysuria, frequency, genital sores, vaginal bleeding and vaginal discharge.  Musculoskeletal:  Negative for arthralgias, gait problem and joint swelling.  Skin:  Negative for color change and rash.  Neurological:  Negative for dizziness, tremors, light-headedness and headaches.  Hematological:  Negative for adenopathy. Does not bruise/bleed easily.  Psychiatric/Behavioral:  Negative for dysphoric mood and sleep disturbance. The patient is not nervous/anxious.    Patient Active Problem List   Diagnosis Date Noted   Personal history of colonic polyps    Polyp of sigmoid colon    Stress incontinence of urine 02/25/2017   Allergy desensitization therapy 01/20/2017   Hiatal hernia with GERD without esophagitis 09/06/2016   Fibrocystic breast changes of both breasts 09/06/2016   Intraductal papilloma of breast, left 09/06/2016   Hyperlipidemia associated with type 2 diabetes mellitus ([place]) 09/06/2016   Essential hypertension 09/06/2016   Type II diabetes mellitus with complication ([place]) 79/89/2119   Thyroid nodule 09/06/2016   Adenomatous colon polyp 09/06/2016    Allergies  Allergen Reactions   Demerol [Meperidine] Hives   Tetracyclines & Related Hives   Simvastatin Other (See Comments)    Muscle pain    Past Surgical History:  Procedure Laterality Date   brachial cleft cyst excison ( Right Neck )  1971   BREAST EXCISIONAL BIOPSY Left    intraductal papilloma   BREAST SURGERY  2007   L intraductal papilloma excision   [place]   emergency   COLONOSCOPY  08/24/2013   tubular adenoma   COLONOSCOPY WITH PROPOFOL N/A 02/05/2019   Procedure: COLONOSCOPY WITH BIOPSIES;  Surgeon: Lucilla Lame, MD;  Location: [place];  Service: Endoscopy;  Laterality: N/A;  Diabetic - oral meds   ESOPHAGOGASTRODUODENOSCOPY  2005   hiatal hernia & polyp found   EXCISION /  BIOPSY BREAST / NIPPLE / DUCT Left 2005   LARYNGOSCOPY  2006   left intraductal papilloma excision of left breast  2007   POLYPECTOMY N/A 02/05/2019   Procedure: POLYPECTOMY;  Surgeon: Lucilla Lame, MD;  Location: [place];  Service: Endoscopy;  Laterality: N/A;   TONSILECTOMY, ADENOIDECTOMY, BILATERAL MYRINGOTOMY AND TUBES      Social History   Tobacco Use   Smoking status: Never   Smokeless tobacco: Never   Tobacco comments:    smoking cessation materials not required  Vaping Use   Vaping Use: Never used  Substance Use Topics   Alcohol use: No   Drug use: No     Medication list has been reviewed and updated.  Current Meds  Medication Sig   atorvastatin (LIPITOR) 20 MG tablet TAKE 1 TABLET (20 MG TOTAL) BY MOUTH DAILY.   EPINEPHrine 0.3 mg/0.3 mL IJ SOAJ injection Inject into the muscle as needed.   fluticasone (FLONASE) 50 MCG/ACT nasal spray Place 2 sprays into both nostrils daily.   glucose blood test strip 1 each by Other route as needed for other. Use as instructed   levocetirizine (XYZAL) 5 MG tablet Take 5 mg by mouth every evening. PRN   lisinopril (ZESTRIL) 10 MG tablet TAKE 1 TABLET DAILY   metFORMIN (GLUCOPHAGE) 500 MG tablet TAKE 1/2 TABLET (250MG     TOTAL) DAILY   omeprazole (PRILOSEC) 20 MG capsule TAKE 1 CAPSULE (20 MG TOTAL) BY  MOUTH 2 (TWO) TIMES DAILY BEFORE A MEAL.   tolterodine (DETROL) 2 MG tablet TAKE 1 TABLET TWICE A DAY   UNABLE TO FIND Allergy Shots- [facility].  Started- 01/2017    PHQ 2/9 Scores 03/21/2021 08/28/2020 03/06/2020 08/24/2019  PHQ - 2 Score 0 0 0 0  PHQ- 9 Score - 0 - 0    GAD 7 : Generalized Anxiety Score 08/28/2020 08/24/2019  Nervous, Anxious, on Edge 0 0  Control/stop worrying 0 0  Worry too much - different things 0 0  Trouble relaxing 0 0  Restless 0 0  Easily annoyed or irritable 0 0  Afraid - awful might happen 0 0  Total GAD 7 Score 0 0  Anxiety Difficulty Not difficult at all Not difficult at all     BP Readings from Last 3 Encounters:  03/22/21 135/84  08/28/20 120/68  03/06/20 118/68    Physical Exam Vitals and nursing note reviewed.  Constitutional:      General: She is not in acute distress.    Appearance: She is well-developed.  HENT:     Head: Normocephalic and atraumatic.     Right Ear: Tympanic membrane and ear canal normal.     Left Ear: Tympanic membrane and ear canal normal.     Nose:     Right Sinus: No maxillary sinus tenderness.     Left Sinus: No maxillary sinus tenderness.  Eyes:     General: No scleral icterus.       Right eye: No discharge.        Left eye: No discharge.     Conjunctiva/sclera: Conjunctivae normal.  Neck:     Thyroid: No thyromegaly.     Vascular: No carotid bruit.  Cardiovascular:     Rate and Rhythm: Normal rate and regular rhythm.     Pulses: Normal pulses.     Heart sounds: Normal heart sounds.  Pulmonary:     Effort: Pulmonary effort is normal. No respiratory distress.     Breath sounds: No wheezing.  Chest:  Breasts:    Right: No mass, nipple discharge, skin change or tenderness.     Left: No mass, nipple discharge, skin change or tenderness.  Abdominal:     General: Bowel sounds are normal.     Palpations: Abdomen is soft.     Tenderness: There is no abdominal tenderness.  Musculoskeletal:     Cervical back: Normal range of motion. No erythema.     Right lower leg: No edema.     Left lower leg: No edema.  Lymphadenopathy:     Cervical: No cervical adenopathy.  Skin:    General: Skin is warm and dry.     Capillary Refill: Capillary refill takes less than 2 seconds.     Findings: No rash.  Neurological:     General: No focal deficit present.     Mental Status: She is alert and oriented to person, place, and time.     Cranial Nerves: No cranial nerve deficit.     Sensory: No sensory deficit.     Deep Tendon Reflexes: Reflexes are normal and symmetric.  Psychiatric:        Attention and Perception: Attention  normal.        Mood and Affect: Mood normal.    Wt Readings from Last 3 Encounters:  03/22/21 182 lb 6.4 oz (82.7 kg)  08/28/20 184 lb (83.5 kg)  03/06/20 186 lb (84.4 kg)    BP 135/84  Pulse 81   Ht _0  (1.575 m)   Wt 182 lb 6.4 oz (82.7 kg)   SpO2 97%   BMI 33.36 kg/m   Assessment and Plan: 1. Essential hypertension Clinically stable exam with well controlled BP. Tolerating medications without side effects at this time. Pt to continue current regimen and low sodium diet; benefits of regular exercise as able discussed.  2. Type II diabetes mellitus with complication ([place]) Clinically stable by exam and report without s/s of hypoglycemia. DM complicated by hypertension and dyslipidemia. Tolerating medications well without side effects or other concerns.  3. Hyperlipidemia associated with type 2 diabetes mellitus ([place]) Tolerating statin medication without side effects at this time LDL is at goal of < 70 on current dose Continue same therapy without change at this time.  4. Thyroid nodule Not palpable today - per [place] last year does not require follow up.  Lab Orders         CBC with Differential/Platelet         Comprehensive metabolic panel         Hemoglobin A1c         Lipid panel         TSH         POCT UA - Microalbumin         POCT Urinalysis Dipstick     She declines all Covid vaccines.  Partially dictated using Editor, commissioning. Any errors are unintentional.  Halina Maidens, MD [facility]  03/22/2021

## 2021-03-23 LAB — HEMOGLOBIN A1C
Est. average glucose Bld gHb Est-mCnc: 154 mg/dL
Hgb A1c MFr Bld: 7 % — ABNORMAL HIGH (ref 4.8–5.6)

## 2021-03-23 LAB — COMPREHENSIVE METABOLIC PANEL
ALT: 25 IU/L (ref 0–32)
AST: 19 IU/L (ref 0–40)
Albumin/Globulin Ratio: 2 (ref 1.2–2.2)
Albumin: 4.4 g/dL (ref 3.8–4.8)
Alkaline Phosphatase: 86 IU/L (ref 44–121)
BUN/Creatinine Ratio: 25 (ref 12–28)
BUN: 23 mg/dL (ref 8–27)
Bilirubin Total: 0.4 mg/dL (ref 0.0–1.2)
CO2: 22 mmol/L (ref 20–29)
Calcium: 9.4 mg/dL (ref 8.7–10.3)
Chloride: 104 mmol/L (ref 96–106)
Creatinine, Ser: 0.91 mg/dL (ref 0.57–1.00)
Globulin, Total: 2.2 g/dL (ref 1.5–4.5)
Glucose: 147 mg/dL — ABNORMAL HIGH (ref 70–99)
Potassium: 4.3 mmol/L (ref 3.5–5.2)
Sodium: 142 mmol/L (ref 134–144)
Total Protein: 6.6 g/dL (ref 6.0–8.5)
eGFR: 68 mL/min/{1.73_m2} (ref >59)

## 2021-03-23 LAB — CBC WITH DIFFERENTIAL/PLATELET
Basophils Absolute: 0 10*3/uL (ref 0.0–0.2)
Basos: 0 %
EOS (ABSOLUTE): 0.1 10*3/uL (ref 0.0–0.4)
Eos: 2 %
Hematocrit: 40.6 % (ref 34.0–46.6)
Hemoglobin: 13.8 g/dL (ref 11.1–15.9)
Immature Grans (Abs): 0 10*3/uL (ref 0.0–0.1)
Immature Granulocytes: 0 %
Lymphocytes Absolute: 2.3 10*3/uL (ref 0.7–3.1)
Lymphs: 36 %
MCH: 28.1 pg (ref 26.6–33.0)
MCHC: 34 g/dL (ref 31.5–35.7)
MCV: 83 fL (ref 79–97)
Monocytes Absolute: 0.5 10*3/uL (ref 0.1–0.9)
Monocytes: 7 %
Neutrophils Absolute: 3.5 10*3/uL (ref 1.4–7.0)
Neutrophils: 55 %
Platelets: 210 10*3/uL (ref 150–450)
RBC: 4.91 x10E6/uL (ref 3.77–5.28)
RDW: 13.5 % (ref 11.7–15.4)
WBC: 6.3 10*3/uL (ref 3.4–10.8)

## 2021-03-23 LAB — TSH: TSH: 1.77 u[IU]/mL (ref 0.450–4.500)

## 2021-03-23 LAB — LIPID PANEL
Chol/HDL Ratio: 4.1 ratio (ref 0.0–4.4)
Cholesterol, Total: 145 mg/dL (ref 100–199)
HDL: 35 mg/dL — ABNORMAL LOW (ref >39)
LDL Chol Calc (NIH): 78 mg/dL (ref 0–99)
Triglycerides: 189 mg/dL — ABNORMAL HIGH (ref 0–149)
VLDL Cholesterol Cal: 32 mg/dL (ref 5–40)

## 2021-03-27 DIAGNOSIS — J301 Allergic rhinitis due to pollen: Secondary | ICD-10-CM | POA: Diagnosis not present

## 2021-05-13 ENCOUNTER — Telehealth: Payer: Medicare Other | Admitting: Physician Assistant

## 2021-05-13 DIAGNOSIS — R300 Dysuria: Secondary | ICD-10-CM

## 2021-05-14 MED ORDER — CEPHALEXIN 500 MG PO CAPS: 500.0000 mg | ORAL_CAPSULE | Freq: Two times a day (BID) | ORAL | 0 refills | Status: AC

## 2021-05-14 NOTE — Progress Notes (Signed)

## 2021-05-17 ENCOUNTER — Other Ambulatory Visit: Payer: Self-pay | Admitting: Internal Medicine

## 2021-05-17 DIAGNOSIS — N393 Stress incontinence (female) (male): Secondary | ICD-10-CM

## 2021-05-17 NOTE — Telephone Encounter (Signed)
Requested Prescriptions  Pending Prescriptions Disp Refills   tolterodine (DETROL) 2 MG tablet [Pharmacy Med Name: TOLTERODINE  TAB 2MG ] 180 tablet 1    Sig: TAKE 1 TABLET TWICE A DAY     Urology:  Bladder Agents Passed - 05/17/2021  2:15 AM      Passed - Valid encounter within last 12 months    Recent Outpatient Visits          1 month ago Essential hypertension   [facility] Glean Hess, MD   8 months ago Type II diabetes mellitus with complication [place])   [facility] Glean Hess, MD   1 year ago Essential hypertension   [facility] Glean Hess, MD   1 year ago Essential hypertension   [facility] Glean Hess, MD   2 years ago Essential hypertension   [facility] Glean Hess, MD      Future Appointments            In 4 months Army Melia Jesse Sans, MD [facility], [place]   In 10 months Army Melia Jesse Sans, MD [facility], [facility]

## 2021-05-19 ENCOUNTER — Other Ambulatory Visit: Payer: Self-pay | Admitting: Internal Medicine

## 2021-05-19 DIAGNOSIS — E1169 Type 2 diabetes mellitus with other specified complication: Secondary | ICD-10-CM

## 2021-05-20 NOTE — Telephone Encounter (Signed)
Requested Prescriptions  Pending Prescriptions Disp Refills   atorvastatin (LIPITOR) 20 MG tablet [Pharmacy Med Name: ATORVASTATIN 20 MG TABLET] 90 tablet 2    Sig: TAKE 1 TABLET BY MOUTH EVERY DAY     Cardiovascular:  Antilipid - Statins Failed - 05/19/2021  9:23 AM      Failed - HDL in normal range and within 360 days    HDL  Date Value Ref Range Status  03/22/2021 35 (L) >39 mg/dL Final         Failed - Triglycerides in normal range and within 360 days    Triglycerides  Date Value Ref Range Status  03/22/2021 189 (H) 0 - 149 mg/dL Final         Passed - Total Cholesterol in normal range and within 360 days    Cholesterol, Total  Date Value Ref Range Status  03/22/2021 145 100 - 199 mg/dL Final         Passed - LDL in normal range and within 360 days    LDL Chol Calc (NIH)  Date Value Ref Range Status  03/22/2021 78 0 - 99 mg/dL Final         Passed - Patient is not pregnant      Passed - Valid encounter within last 12 months    Recent Outpatient Visits          1 month ago Essential hypertension   [facility] Glean Hess, MD   8 months ago Type II diabetes mellitus with complication [facility])   [facility] Glean Hess, MD   1 year ago Essential hypertension   [facility] Glean Hess, MD   1 year ago Essential hypertension   [facility] Glean Hess, MD   2 years ago Essential hypertension   [facility] Glean Hess, MD      Future Appointments            In 4 months Army Melia Jesse Sans, MD [facility], [place]   In 10 months Army Melia Jesse Sans, MD [facility], [facility]

## 2021-06-13 DIAGNOSIS — J301 Allergic rhinitis due to pollen: Secondary | ICD-10-CM | POA: Diagnosis not present

## 2021-06-26 DIAGNOSIS — J301 Allergic rhinitis due to pollen: Secondary | ICD-10-CM | POA: Diagnosis not present

## 2021-07-04 DIAGNOSIS — L578 Other skin changes due to chronic exposure to nonionizing radiation: Secondary | ICD-10-CM | POA: Diagnosis not present

## 2021-07-04 DIAGNOSIS — Z86018 Personal history of other benign neoplasm: Secondary | ICD-10-CM | POA: Diagnosis not present

## 2021-07-04 DIAGNOSIS — D1801 Hemangioma of skin and subcutaneous tissue: Secondary | ICD-10-CM | POA: Diagnosis not present

## 2021-09-03 DIAGNOSIS — J301 Allergic rhinitis due to pollen: Secondary | ICD-10-CM | POA: Diagnosis not present

## 2021-09-11 DIAGNOSIS — J301 Allergic rhinitis due to pollen: Secondary | ICD-10-CM | POA: Diagnosis not present

## 2021-09-20 ENCOUNTER — Encounter: Payer: Self-pay | Admitting: Internal Medicine

## 2021-09-20 ENCOUNTER — Ambulatory Visit (INDEPENDENT_AMBULATORY_CARE_PROVIDER_SITE_OTHER): Payer: Medicare Other | Admitting: Internal Medicine

## 2021-09-20 VITALS — BP 122/72 | HR 81 | Ht 62.0 in | Wt 168.0 lb

## 2021-09-20 DIAGNOSIS — N393 Stress incontinence (female) (male): Secondary | ICD-10-CM

## 2021-09-20 DIAGNOSIS — I10 Essential (primary) hypertension: Secondary | ICD-10-CM

## 2021-09-20 DIAGNOSIS — E118 Type 2 diabetes mellitus with unspecified complications: Secondary | ICD-10-CM | POA: Diagnosis not present

## 2021-09-20 MED ORDER — METFORMIN HCL 500 MG PO TABS: ORAL_TABLET | ORAL | 1 refills | Status: DC

## 2021-09-20 MED ORDER — TOLTERODINE TARTRATE 2 MG PO TABS: 2.0000 mg | ORAL_TABLET | Freq: Two times a day (BID) | ORAL | 1 refills | Status: DC

## 2021-09-20 MED ORDER — LISINOPRIL 10 MG PO TABS: 10.0000 mg | ORAL_TABLET | Freq: Every day | ORAL | 1 refills | Status: DC

## 2021-09-20 NOTE — Progress Notes (Signed)
? ? ?Date:  09/20/2021  ? ?Name:  Pamela Rosario   DOB:  [birth year]   MRN:  [id number] ? ? ?Chief Complaint: Diabetes and Hypertension ? ?Diabetes ?She presents for her follow-up diabetic visit. She has type 2 diabetes mellitus. Her disease course has been stable. Pertinent negatives for hypoglycemia include no headaches or tremors. There are no diabetic associated symptoms. Pertinent negatives for diabetes include no chest pain, no fatigue, no polydipsia and no polyuria. Symptoms are improving. Current diabetic treatment includes oral agent (monotherapy). She is compliant with treatment all of the time. Her weight is decreasing steadily. She is following a generally healthy diet. She participates in exercise three times a week. There is no change in her home blood glucose trend. Her breakfast blood glucose is taken between 6-7 am. Her breakfast blood glucose range is generally 110-130 mg/dl. An ACE inhibitor/angiotensin II receptor blocker is being taken. Eye exam is current.  ?Hypertension ?This is a chronic problem. The problem is controlled. Pertinent negatives include no chest pain, headaches, palpitations or shortness of breath. There are no associated agents to hypertension. Past treatments include ACE inhibitors. The current treatment provides significant improvement. Identifiable causes of hypertension include a thyroid problem.  ?Hyperlipidemia ?This is a chronic problem. The problem is controlled. Pertinent negatives include no chest pain or shortness of breath.  ? ?Lab Results  ?Component Value Date  ? NA 142 03/22/2021  ? K 4.3 03/22/2021  ? CO2 22 03/22/2021  ? GLUCOSE 147 (H) 03/22/2021  ? BUN 23 03/22/2021  ? CREATININE 0.91 03/22/2021  ? CALCIUM 9.4 03/22/2021  ? EGFR 68 03/22/2021  ? GFRNONAA 67 03/06/2020  ? ?Lab Results  ?Component Value Date  ? CHOL 145 03/22/2021  ? HDL 35 (L) 03/22/2021  ? [place] 78 03/22/2021  ? TRIG 189 (H) 03/22/2021  ? CHOLHDL 4.1 03/22/2021  ? ?Lab Results  ?Component Value  Date  ? TSH 1.770 03/22/2021  ? ?Lab Results  ?Component Value Date  ? HGBA1C 7.0 (H) 03/22/2021  ? ?Lab Results  ?Component Value Date  ? WBC 6.3 03/22/2021  ? HGB 13.8 03/22/2021  ? HCT 40.6 03/22/2021  ? MCV 83 03/22/2021  ? PLT 210 03/22/2021  ? ?Lab Results  ?Component Value Date  ? ALT 25 03/22/2021  ? AST 19 03/22/2021  ? ALKPHOS 86 03/22/2021  ? BILITOT 0.4 03/22/2021  ? ?No results found for: 25OHVITD2, [place], VD25OH  ? ?Review of Systems  ?Constitutional:  Negative for appetite change, fatigue, fever and unexpected weight change.  ?HENT:  Negative for tinnitus and trouble swallowing.   ?Eyes:  Negative for visual disturbance.  ?Respiratory:  Negative for cough, chest tightness and shortness of breath.   ?Cardiovascular:  Negative for chest pain, palpitations and leg swelling.  ?Gastrointestinal:  Negative for abdominal pain.  ?Endocrine: Negative for polydipsia and polyuria.  ?Genitourinary:  Negative for dysuria and hematuria.  ?Musculoskeletal:  Negative for arthralgias.  ?Neurological:  Negative for tremors, numbness and headaches.  ?Psychiatric/Behavioral:  Negative for dysphoric mood.   ? ?Patient Active Problem List  ? Diagnosis Date Noted  ? Personal history of colonic polyps   ? Polyp of sigmoid colon   ? Stress incontinence of urine 02/25/2017  ? Allergy desensitization therapy 01/20/2017  ? Hiatal hernia with GERD without esophagitis 09/06/2016  ? Fibrocystic breast changes of both breasts 09/06/2016  ? Intraductal papilloma of breast, left 09/06/2016  ? Hyperlipidemia associated with type 2 diabetes mellitus ([place]) 09/06/2016  ? Essential hypertension  09/06/2016  ? Type II diabetes mellitus with complication ([place]) 16/03/9603  ? Adenomatous colon polyp 09/06/2016  ? ? ?Allergies  ?Allergen Reactions  ? Demerol [Meperidine] Hives  ? Tetracyclines & Related Hives  ? Simvastatin Other (See Comments)  ?  Muscle pain  ? ? ?Past Surgical History:  ?Procedure Laterality Date  ? brachial cleft cyst  excison ( Right Neck )  1971  ? BREAST EXCISIONAL BIOPSY Left   ? intraductal papilloma  ? BREAST SURGERY  2007  ? L intraductal papilloma excision  ? [place]  ? emergency  ? COLONOSCOPY  08/24/2013  ? tubular adenoma  ? COLONOSCOPY WITH PROPOFOL N/A 02/05/2019  ? Procedure: COLONOSCOPY WITH BIOPSIES;  Surgeon: Lucilla Lame, MD;  Location: [place];  Service: Endoscopy;  Laterality: N/A;  Diabetic - oral meds  ? ESOPHAGOGASTRODUODENOSCOPY  2005  ? hiatal hernia & polyp found  ? EXCISION / BIOPSY BREAST / NIPPLE / DUCT Left 2005  ? LARYNGOSCOPY  2006  ? left intraductal papilloma excision of left breast  2007  ? POLYPECTOMY N/A 02/05/2019  ? Procedure: POLYPECTOMY;  Surgeon: Lucilla Lame, MD;  Location: [place];  Service: Endoscopy;  Laterality: N/A;  ? TONSILECTOMY, ADENOIDECTOMY, BILATERAL MYRINGOTOMY AND TUBES    ? ? ?Social History  ? ?Tobacco Use  ? Smoking status: Never  ? Smokeless tobacco: Never  ? Tobacco comments:  ?  smoking cessation materials not required  ?Vaping Use  ? Vaping Use: Never used  ?Substance Use Topics  ? Alcohol use: No  ? Drug use: No  ? ? ? ?Medication list has been reviewed and updated. ? ?Current Meds  ?Medication Sig  ? atorvastatin (LIPITOR) 20 MG tablet TAKE 1 TABLET BY MOUTH EVERY DAY  ? EPINEPHrine 0.3 mg/0.3 mL IJ SOAJ injection Inject into the muscle as needed.  ? fluticasone (FLONASE) 50 MCG/ACT nasal spray Place 2 sprays into both nostrils as needed.  ? glucose blood test strip 1 each by Other route as needed for other. Use as instructed  ? levocetirizine (XYZAL) 5 MG tablet Take 5 mg by mouth every evening. PRN  ? lisinopril (ZESTRIL) 10 MG tablet Take 1 tablet (10 mg total) by mouth daily.  ? metFORMIN (GLUCOPHAGE) 500 MG tablet TAKE 1/2 TABLET (250MG     TOTAL) DAILY  ? omeprazole (PRILOSEC) 20 MG capsule TAKE 1 CAPSULE (20 MG TOTAL) BY MOUTH 2 (TWO) TIMES DAILY BEFORE A MEAL.  ? tolterodine (DETROL) 2 MG tablet TAKE 1 TABLET TWICE A DAY  ?  UNABLE TO FIND Allergy Shots- [facility].  ?Started- 01/2017  ? ? ? ?  09/20/2021  ?  9:45 AM 08/28/2020  ?  2:51 PM 08/24/2019  ?  9:13 AM  ?GAD 7 : Generalized Anxiety Score  ?Nervous, Anxious, on Edge 0 0 0  ?Control/stop worrying 0 0 0  ?Worry too much - different things 0 0 0  ?Trouble relaxing 0 0 0  ?Restless 0 0 0  ?Easily annoyed or irritable 0 0 0  ?Afraid - awful might happen 0 0 0  ?Total GAD 7 Score 0 0 0  ?Anxiety Difficulty Not difficult at all Not difficult at all Not difficult at all  ? ? ? ?  09/20/2021  ?  9:45 AM  ?Depression screen PHQ 2/9  ?Decreased Interest 0  ?Down, Depressed, Hopeless 0  ?PHQ - 2 Score 0  ?Altered sleeping 0  ?Tired, decreased energy 0  ?Change in  appetite 0  ?Feeling bad or failure about yourself  0  ?Trouble concentrating 0  ?Moving slowly or fidgety/restless 0  ?Suicidal thoughts 0  ?PHQ-9 Score 0  ?Difficult doing work/chores Not difficult at all  ? ? ?BP Readings from Last 3 Encounters:  ?09/20/21 122/72  ?03/22/21 135/84  ?08/28/20 120/68  ? ? ?Physical Exam ?Vitals and nursing note reviewed.  ?Constitutional:   ?   General: She is not in acute distress. ?   Appearance: She is well-developed.  ?HENT:  ?   Head: Normocephalic and atraumatic.  ?Neck:  ?   Vascular: No carotid bruit.  ?Cardiovascular:  ?   Rate and Rhythm: Normal rate and regular rhythm.  ?   Pulses: Normal pulses.  ?Pulmonary:  ?   Effort: Pulmonary effort is normal. No respiratory distress.  ?   Breath sounds: No wheezing or rhonchi.  ?Musculoskeletal:  ?   Cervical back: Normal range of motion.  ?   Right lower leg: No edema.  ?   Left lower leg: No edema.  ?Lymphadenopathy:  ?   Cervical: No cervical adenopathy.  ?Skin: ?   General: Skin is warm and dry.  ?   Findings: No rash.  ?Neurological:  ?   Mental Status: She is alert and oriented to person, place, and time.  ?Psychiatric:     ?   Mood and Affect: Mood normal.     ?   Behavior: Behavior normal.  ? ? ?Wt Readings from Last 3 Encounters:   ?09/20/21 168 lb (76.2 kg)  ?03/22/21 182 lb 6.4 oz (82.7 kg)  ?08/28/20 184 lb (83.5 kg)  ? ? ?BP 122/72   Pulse 81   Ht 5' 2"  (1.575 m)   Wt 168 lb (76.2 kg)   SpO2 95%   BMI 30.73 kg/m?  ? ?Asses

## 2021-09-21 LAB — BASIC METABOLIC PANEL
BUN/Creatinine Ratio: 16 (ref 12–28)
BUN: 15 mg/dL (ref 8–27)
CO2: 23 mmol/L (ref 20–29)
Calcium: 9.8 mg/dL (ref 8.7–10.3)
Chloride: 105 mmol/L (ref 96–106)
Creatinine, Ser: 0.95 mg/dL (ref 0.57–1.00)
Glucose: 129 mg/dL — ABNORMAL HIGH (ref 70–99)
Potassium: 4.4 mmol/L (ref 3.5–5.2)
Sodium: 142 mmol/L (ref 134–144)
eGFR: 64 mL/min/{1.73_m2} (ref >59)

## 2021-09-21 LAB — MICROALBUMIN / CREATININE URINE RATIO
Creatinine, Urine: 90.2 mg/dL
Microalb/Creat Ratio: 5 mg/g creat (ref 0–29)
Microalbumin, Urine: 4.3 ug/mL

## 2021-09-21 LAB — HEMOGLOBIN A1C
Est. average glucose Bld gHb Est-mCnc: 146 mg/dL
Hgb A1c MFr Bld: 6.7 % — ABNORMAL HIGH (ref 4.8–5.6)

## 2021-09-22 ENCOUNTER — Other Ambulatory Visit: Payer: Self-pay | Admitting: Internal Medicine

## 2021-09-22 DIAGNOSIS — I10 Essential (primary) hypertension: Secondary | ICD-10-CM

## 2021-09-24 NOTE — Telephone Encounter (Signed)
Medication was refilled 09/20/20 by PCP for 90, 1 refill. Refill sent to [facility] mail pharmacy. Will refuse this duplicate request. ? ? ?Requested Prescriptions  ?Pending Prescriptions Disp Refills  ?? lisinopril (ZESTRIL) 10 MG tablet [Pharmacy Med Name: LISINOPRIL TAB '10MG'$ ] 90 tablet 1  ?  Sig: TAKE 1 TABLET DAILY  ?  ? Cardiovascular:  ACE Inhibitors Passed - 09/22/2021  6:28 AM  ?  ?  Passed - Cr in normal range and within 180 days  ?  Creatinine, Ser  ?Date Value Ref Range Status  ?09/20/2021 0.95 0.57 - 1.00 mg/dL Final  ?   ?  ?  Passed - K in normal range and within 180 days  ?  Potassium  ?Date Value Ref Range Status  ?09/20/2021 4.4 3.5 - 5.2 mmol/L Final  ?   ?  ?  Passed - Patient is not pregnant  ?  ?  Passed - Last BP in normal range  ?  BP Readings from Last 1 Encounters:  ?09/20/21 122/72  ?   ?  ?  Passed - Valid encounter within last 6 months  ?  Recent Outpatient Visits   ?      ? 4 days ago Essential hypertension  ? [facility] Glean Hess, MD  ? 6 months ago Essential hypertension  ? [facility] Glean Hess, MD  ? 1 year ago Type II diabetes mellitus with complication [facility])  ? [facility] Glean Hess, MD  ? 1 year ago Essential hypertension  ? [facility] Glean Hess, MD  ? 2 years ago Essential hypertension  ? [facility] Glean Hess, MD  ?  ?  ?Future Appointments   ?        ? In 6 months Army Melia Jesse Sans, MD [facility], [place]  ?  ? ?  ?  ?  ? ? ?

## 2021-10-05 ENCOUNTER — Other Ambulatory Visit: Payer: Self-pay | Admitting: Internal Medicine

## 2021-10-05 DIAGNOSIS — E118 Type 2 diabetes mellitus with unspecified complications: Secondary | ICD-10-CM

## 2021-10-05 NOTE — Telephone Encounter (Signed)
Requested Prescriptions  ?Pending Prescriptions Disp Refills  ?? metFORMIN (GLUCOPHAGE) 500 MG tablet [Pharmacy Med Name: METFORMIN TAB 500MG] 45 tablet 5  ?  Sig: TAKE 1/2 TABLET DAILY  ?  ? Endocrinology:  Diabetes - Biguanides Failed - 10/05/2021  2:06 AM  ?  ?  Failed - B12 Level in normal range and within 720 days  ?  No results found for: VITAMINB12   ?  ?  Passed - Cr in normal range and within 360 days  ?  Creatinine, Ser  ?Date Value Ref Range Status  ?09/20/2021 0.95 0.57 - 1.00 mg/dL Final  ?   ?  ?  Passed - HBA1C is between 0 and 7.9 and within 180 days  ?  Hgb A1c MFr Bld  ?Date Value Ref Range Status  ?09/20/2021 6.7 (H) 4.8 - 5.6 % Final  ?  Comment:  ?           Prediabetes: 5.7 - 6.4 ?         Diabetes: >6.4 ?         Glycemic control for adults with diabetes: <7.0 ?  ?   ?  ?  Passed - eGFR in normal range and within 360 days  ?  GFR calc Af Amer  ?Date Value Ref Range Status  ?03/06/2020 77 >59 mL/min/1.73 Final  ?  Comment:  ?  **[facility] currently reports eGFR in compliance with the current** ?  recommendations of the [employer]. [facility] will ?  update reporting as new guidelines are published from the NKF-ASN ?  Task force. ?  ? ?GFR calc non Af Amer  ?Date Value Ref Range Status  ?03/06/2020 67 >59 mL/min/1.73 Final  ? ?eGFR  ?Date Value Ref Range Status  ?09/20/2021 64 >59 mL/min/1.73 Final  ?   ?  ?  Passed - Valid encounter within last 6 months  ?  Recent Outpatient Visits   ?      ? 2 weeks ago Essential hypertension  ? [facility] Glean Hess, MD  ? 6 months ago Essential hypertension  ? [facility] Glean Hess, MD  ? 1 year ago Type II diabetes mellitus with complication [facility])  ? [facility] Glean Hess, MD  ? 1 year ago Essential hypertension  ? [facility] Glean [facility], [facility]  ? 2 years ago Essential hypertension  ? [facility] Glean Hess, MD  ?  ?  ?Future Appointments   ?        ? In 5 months  Glean Hess, MD [facility]  ?  ? ?  ?  ?  Passed - CBC within normal limits and completed in the last 12 months  ?  WBC  ?Date Value Ref Range Status  ?03/22/2021 6.3 3.4 - 10.8 x10E3/uL Final  ? ?RBC  ?Date Value Ref Range Status  ?03/22/2021 4.91 3.77 - 5.28 x10E6/uL Final  ?03/18/2018 0 (A) 4.04 - 5.48 M/uL Final  ? ?Hemoglobin  ?Date Value Ref Range Status  ?03/22/2021 13.8 11.1 - 15.9 g/dL Final  ? ?Hematocrit  ?Date Value Ref Range Status  ?03/22/2021 40.6 34.0 - 46.6 % Final  ? ?MCHC  ?Date Value Ref Range Status  ?03/22/2021 34.0 31.5 - 35.7 g/dL Final  ? ?MCH  ?Date Value Ref Range Status  ?03/22/2021 28.1 26.6 - 33.0 pg Final  ? ?MCV  ?Date Value Ref Range Status  ?03/22/2021 83  79 - 97 fL Final  ? ?No results found for: PLTCOUNTKUC, LABPLAT, [place]erv[place] ?RDW  ?Date Value Ref Range Status  ?03/22/2021 13.5 11.7 - 15.4 % Final  ? ?  ?  ?  ? ?

## 2021-10-16 ENCOUNTER — Encounter: Payer: Self-pay | Admitting: Internal Medicine

## 2021-10-24 DIAGNOSIS — H43813 Vitreous degeneration, bilateral: Secondary | ICD-10-CM | POA: Diagnosis not present

## 2021-10-24 DIAGNOSIS — E119 Type 2 diabetes mellitus without complications: Secondary | ICD-10-CM | POA: Diagnosis not present

## 2021-10-24 DIAGNOSIS — H2511 Age-related nuclear cataract, right eye: Secondary | ICD-10-CM | POA: Diagnosis not present

## 2021-10-24 LAB — HM DIABETES EYE EXAM

## 2021-11-15 ENCOUNTER — Encounter: Payer: Self-pay | Admitting: Internal Medicine

## 2021-11-19 DIAGNOSIS — J301 Allergic rhinitis due to pollen: Secondary | ICD-10-CM | POA: Diagnosis not present

## 2021-11-27 DIAGNOSIS — J301 Allergic rhinitis due to pollen: Secondary | ICD-10-CM | POA: Diagnosis not present

## 2022-01-21 ENCOUNTER — Other Ambulatory Visit: Payer: Self-pay | Admitting: Internal Medicine

## 2022-01-21 DIAGNOSIS — Z1231 Encounter for screening mammogram for malignant neoplasm of breast: Secondary | ICD-10-CM

## 2022-02-11 DIAGNOSIS — J301 Allergic rhinitis due to pollen: Secondary | ICD-10-CM | POA: Diagnosis not present

## 2022-02-22 DIAGNOSIS — J301 Allergic rhinitis due to pollen: Secondary | ICD-10-CM | POA: Diagnosis not present

## 2022-02-25 ENCOUNTER — Ambulatory Visit
Admission: RE | Admit: 2022-02-25 | Discharge: 2022-02-25 | Disposition: A | Discharge status: Home / Self Care | Payer: Medicare Other | Source: Ambulatory Visit | Attending: Internal Medicine | Admitting: Internal Medicine

## 2022-02-25 DIAGNOSIS — Z1231 Encounter for screening mammogram for malignant neoplasm of breast: Secondary | ICD-10-CM | POA: Diagnosis not present

## 2022-02-26 ENCOUNTER — Other Ambulatory Visit: Payer: Self-pay | Admitting: Internal Medicine

## 2022-02-26 DIAGNOSIS — R928 Other abnormal and inconclusive findings on diagnostic imaging of breast: Secondary | ICD-10-CM

## 2022-02-26 DIAGNOSIS — N6489 Other specified disorders of breast: Secondary | ICD-10-CM

## 2022-03-05 DIAGNOSIS — J301 Allergic rhinitis due to pollen: Secondary | ICD-10-CM | POA: Diagnosis not present

## 2022-03-13 ENCOUNTER — Ambulatory Visit
Admission: RE | Admit: 2022-03-13 | Discharge: 2022-03-13 | Disposition: A | Discharge status: Home / Self Care | Payer: Medicare Other | Source: Ambulatory Visit | Attending: Internal Medicine | Admitting: Internal Medicine

## 2022-03-13 DIAGNOSIS — R928 Other abnormal and inconclusive findings on diagnostic imaging of breast: Secondary | ICD-10-CM | POA: Diagnosis not present

## 2022-03-13 DIAGNOSIS — N6489 Other specified disorders of breast: Secondary | ICD-10-CM

## 2022-03-18 ENCOUNTER — Other Ambulatory Visit: Payer: Self-pay | Admitting: Internal Medicine

## 2022-03-18 DIAGNOSIS — I10 Essential (primary) hypertension: Secondary | ICD-10-CM

## 2022-03-18 NOTE — Telephone Encounter (Signed)
Requested Prescriptions  Pending Prescriptions Disp Refills  . lisinopril (ZESTRIL) 10 MG tablet [Pharmacy Med Name: LISINOPRIL TAB '10MG'$ ] 90 tablet 0    Sig: TAKE 1 TABLET DAILY     Cardiovascular:  ACE Inhibitors Passed - 03/18/2022 12:51 AM      Passed - Cr in normal range and within 180 days    Creatinine, Ser  Date Value Ref Range Status  09/20/2021 0.95 0.57 - 1.00 mg/dL Final         Passed - K in normal range and within 180 days    Potassium  Date Value Ref Range Status  09/20/2021 4.4 3.5 - 5.2 mmol/L Final         Passed - Patient is not pregnant      Passed - Last BP in normal range    BP Readings from Last 1 Encounters:  09/20/21 122/72         Passed - Valid encounter within last 6 months    Recent Outpatient Visits          5 months ago Essential hypertension   [facility], Jesse Sans, MD   12 months ago Essential hypertension   [facility], Jesse Sans, MD   1 year ago Type II diabetes mellitus with complication [facility])   [facility], Jesse Sans, MD   2 years ago Essential hypertension   [facility], Jesse Sans, MD   2 years ago Essential hypertension   [facility], Jesse Sans, MD      Future Appointments            In 1 week Army Melia, Jesse Sans, MD [facility] [facility], [facility]

## 2022-03-22 ENCOUNTER — Ambulatory Visit (INDEPENDENT_AMBULATORY_CARE_PROVIDER_SITE_OTHER): Payer: Medicare Other

## 2022-03-22 VITALS — Ht 62.0 in | Wt 168.0 lb

## 2022-03-22 DIAGNOSIS — Z0000 Encounter for general adult medical examination without abnormal findings: Secondary | ICD-10-CM
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
## 2022-03-25 ENCOUNTER — Ambulatory Visit: Payer: Medicare Other

## 2022-03-26 ENCOUNTER — Ambulatory Visit (INDEPENDENT_AMBULATORY_CARE_PROVIDER_SITE_OTHER): Payer: Medicare Other | Admitting: Internal Medicine

## 2022-03-26 ENCOUNTER — Encounter: Payer: Self-pay | Admitting: Internal Medicine

## 2022-03-26 VITALS — BP 108/72 | HR 85 | Ht 62.0 in | Wt 171.0 lb

## 2022-03-26 DIAGNOSIS — N393 Stress incontinence (female) (male): Secondary | ICD-10-CM | POA: Diagnosis not present

## 2022-03-26 DIAGNOSIS — E785 Hyperlipidemia, unspecified: Secondary | ICD-10-CM | POA: Diagnosis not present

## 2022-03-26 DIAGNOSIS — I10 Essential (primary) hypertension: Secondary | ICD-10-CM | POA: Diagnosis not present

## 2022-03-26 DIAGNOSIS — K219 Gastro-esophageal reflux disease without esophagitis: Secondary | ICD-10-CM

## 2022-03-26 DIAGNOSIS — E118 Type 2 diabetes mellitus with unspecified complications: Secondary | ICD-10-CM

## 2022-03-26 DIAGNOSIS — E1169 Type 2 diabetes mellitus with other specified complication: Secondary | ICD-10-CM

## 2022-03-26 DIAGNOSIS — K449 Diaphragmatic hernia without obstruction or gangrene: Secondary | ICD-10-CM | POA: Diagnosis not present

## 2022-03-26 LAB — POCT URINALYSIS DIPSTICK
Bilirubin, UA: NEGATIVE
Blood, UA: NEGATIVE
Glucose, UA: NEGATIVE
Ketones, UA: NEGATIVE
Leukocytes, UA: NEGATIVE
Nitrite, UA: NEGATIVE
Protein, UA: NEGATIVE
Spec Grav, UA: 1.025 (ref 1.010–1.025)
Urobilinogen, UA: 0.2 E.U./dL
pH, UA: 7 (ref 5.0–8.0)

## 2022-03-26 MED ORDER — METFORMIN HCL 500 MG PO TABS: ORAL_TABLET | ORAL | 3 refills | Status: DC

## 2022-03-26 MED ORDER — TOLTERODINE TARTRATE 2 MG PO TABS: 2.0000 mg | ORAL_TABLET | Freq: Two times a day (BID) | ORAL | 3 refills | Status: DC

## 2022-03-26 MED ORDER — ATORVASTATIN CALCIUM 20 MG PO TABS: 20.0000 mg | ORAL_TABLET | Freq: Every day | ORAL | 3 refills | Status: DC

## 2022-03-26 MED ORDER — LISINOPRIL 10 MG PO TABS: 10.0000 mg | ORAL_TABLET | Freq: Every day | ORAL | 3 refills | Status: DC

## 2022-03-26 MED ORDER — OMEPRAZOLE 20 MG PO CPDR: 20.0000 mg | DELAYED_RELEASE_CAPSULE | Freq: Two times a day (BID) | ORAL | 3 refills | Status: DC

## 2022-03-26 NOTE — Progress Notes (Signed)
Date:  03/26/2022   Name:  Pamela Rosario   DOB:  [birth year]   MRN:  [id number]   Chief Complaint: Annual Exam Pamela Rosario is a 71 y.o. female who presents today for her Complete Annual Exam. She feels fairly well. She reports exercising some. She reports she is sleeping well. Breast complaints - none.  Mammogram: 02/2022 right asymmetry - Dx normal. DEXA: 2020 Normal Pap smear: discontinued Colonoscopy: 02/2019 repeat 5 yrs  Health Maintenance Due  Topic Date Due   FOOT EXAM  03/22/2022   HEMOGLOBIN A1C  03/22/2022    Immunization History  Administered Date(s) Administered   Fluad Quad(high Dose 65+) 03/03/2019   Hepatitis B 06/03/1990   IPV 06/04/1955, 06/03/1966   Influenza, High Dose Seasonal PF 02/26/2018   Influenza,inj,Quad PF,6+ Mos 02/25/2017   Influenza-Unspecified 01/02/2016   MMR 06/03/1981   Pneumococcal Conjugate-13 09/28/2018   Pneumococcal Polysaccharide-23 11/15/2009   Tdap 06/03/2012   Varicella 06/03/1969   Zoster Recombinat (Shingrix) 08/14/2017, 12/22/2017    Hypertension This is a chronic problem. The problem is controlled. Pertinent negatives include no chest pain, headaches, palpitations or shortness of breath. Past treatments include ACE inhibitors. The current treatment provides significant improvement. There is no history of kidney disease, CAD/MI or CVA.  Diabetes She presents for her follow-up diabetic visit. She has type 2 diabetes mellitus. Pertinent negatives for hypoglycemia include no dizziness, headaches, nervousness/anxiousness or tremors. Pertinent negatives for diabetes include no chest pain, no fatigue, no polydipsia and no polyuria. Pertinent negatives for diabetic complications include no CVA. Current diabetic treatment includes oral agent (monotherapy) (metformin). Her weight is stable. An ACE inhibitor/angiotensin II receptor blocker is being taken. Eye exam is current.  Hyperlipidemia This is a chronic problem. The problem is  controlled. Pertinent negatives include no chest pain or shortness of breath. Current antihyperlipidemic treatment includes statins. The current treatment provides significant improvement of lipids.    Lab Results  Component Value Date   NA 142 09/20/2021   K 4.4 09/20/2021   CO2 23 09/20/2021   GLUCOSE 129 (H) 09/20/2021   BUN 15 09/20/2021   CREATININE 0.95 09/20/2021   CALCIUM 9.8 09/20/2021   EGFR 64 09/20/2021   GFRNONAA 67 03/06/2020   Lab Results  Component Value Date   CHOL 145 03/22/2021   HDL 35 (L) 03/22/2021   LDLCALC 78 03/22/2021   TRIG 189 (H) 03/22/2021   CHOLHDL 4.1 03/22/2021   Lab Results  Component Value Date   TSH 1.770 03/22/2021   Lab Results  Component Value Date   HGBA1C 6.7 (H) 09/20/2021   Lab Results  Component Value Date   WBC 6.3 03/22/2021   HGB 13.8 03/22/2021   HCT 40.6 03/22/2021   MCV 83 03/22/2021   PLT 210 03/22/2021   Lab Results  Component Value Date   ALT 25 03/22/2021   AST 19 03/22/2021   ALKPHOS 86 03/22/2021   BILITOT 0.4 03/22/2021   No results found for: "25OHVITD2", "25OHVITD3", "VD25OH"   Review of Systems  Constitutional:  Negative for chills, fatigue and fever.  HENT:  Negative for congestion, hearing loss, tinnitus, trouble swallowing and voice change.   Eyes:  Negative for visual disturbance.  Respiratory:  Negative for cough, chest tightness, shortness of breath and wheezing.   Cardiovascular:  Negative for chest pain, palpitations and leg swelling.  Gastrointestinal:  Negative for abdominal pain, constipation, diarrhea and vomiting.  Endocrine: Negative for polydipsia and polyuria.  Genitourinary:  Negative for dysuria, frequency,  genital sores, vaginal bleeding and vaginal discharge.  Musculoskeletal:  Negative for arthralgias, gait problem and joint swelling.  Skin:  Negative for color change and rash.  Neurological:  Negative for dizziness, tremors, light-headedness and headaches.  Hematological:   Negative for adenopathy. Does not bruise/bleed easily.  Psychiatric/Behavioral:  Negative for dysphoric mood and sleep disturbance. The patient is not nervous/anxious.     Patient Active Problem List   Diagnosis Date Noted   Personal history of colonic polyps    Polyp of sigmoid colon    Stress incontinence of urine 02/25/2017   Hiatal hernia with GERD without esophagitis 09/06/2016   Fibrocystic breast changes of both breasts 09/06/2016   Intraductal papilloma of breast, left 09/06/2016   Hyperlipidemia associated with type 2 diabetes mellitus ([place]) 09/06/2016   Essential hypertension 09/06/2016   Type II diabetes mellitus with complication ([place]) 73/53/2992   Adenomatous colon polyp 09/06/2016    Allergies  Allergen Reactions   Demerol [Meperidine] Hives   Tetracyclines & Related Hives   Simvastatin Other (See Comments)    Muscle pain    Past Surgical History:  Procedure Laterality Date   brachial cleft cyst excison ( Right Neck )  1971   BREAST EXCISIONAL BIOPSY Left    intraductal papilloma   BREAST SURGERY  2007   L intraductal papilloma excision   [place]   emergency   COLONOSCOPY  08/24/2013   tubular adenoma   COLONOSCOPY WITH PROPOFOL N/A 02/05/2019   Procedure: COLONOSCOPY WITH BIOPSIES;  Surgeon: Lucilla Lame, MD;  Location: [place];  Service: Endoscopy;  Laterality: N/A;  Diabetic - oral meds   ESOPHAGOGASTRODUODENOSCOPY  2005   hiatal hernia & polyp found   EXCISION / BIOPSY BREAST / NIPPLE / DUCT Left 2005   LARYNGOSCOPY  2006   left intraductal papilloma excision of left breast  2007   POLYPECTOMY N/A 02/05/2019   Procedure: POLYPECTOMY;  Surgeon: Lucilla Lame, MD;  Location: [place];  Service: Endoscopy;  Laterality: N/A;   TONSILECTOMY, ADENOIDECTOMY, BILATERAL MYRINGOTOMY AND TUBES      Social History   Tobacco Use   Smoking status: Never   Smokeless tobacco: Never   Tobacco comments:    smoking cessation  materials not required  Vaping Use   Vaping Use: Never used  Substance Use Topics   Alcohol use: No   Drug use: No     Medication list has been reviewed and updated.  Current Meds  Medication Sig   atorvastatin (LIPITOR) 20 MG tablet TAKE 1 TABLET BY MOUTH EVERY DAY   glucose blood test strip 1 each by Other route as needed for other. Use as instructed   levocetirizine (XYZAL) 5 MG tablet Take 5 mg by mouth every evening. PRN   lisinopril (ZESTRIL) 10 MG tablet TAKE 1 TABLET DAILY   metFORMIN (GLUCOPHAGE) 500 MG tablet TAKE 1/2 TABLET DAILY   omeprazole (PRILOSEC) 20 MG capsule TAKE 1 CAPSULE (20 MG TOTAL) BY MOUTH 2 (TWO) TIMES DAILY BEFORE A MEAL.   tolterodine (DETROL) 2 MG tablet Take 1 tablet (2 mg total) by mouth 2 (two) times daily.       03/26/2022    9:22 AM 09/20/2021    9:45 AM 08/28/2020    2:51 PM 08/24/2019    9:13 AM  GAD 7 : Generalized Anxiety Score  Nervous, Anxious, on Edge 0 0 0 0  Control/stop worrying 0 0 0 0  Worry too much - different things 0  0 0 0  Trouble relaxing 0 0 0 0  Restless 0 0 0 0  Easily annoyed or irritable 0 0 0 0  Afraid - awful might happen 0 0 0 0  Total GAD 7 Score 0 0 0 0  Anxiety Difficulty Not difficult at all Not difficult at all Not difficult at all Not difficult at all       03/26/2022    9:22 AM 03/22/2022   11:09 AM 09/20/2021    9:45 AM  Depression screen PHQ 2/9  Decreased Interest 0 0 0  Down, Depressed, Hopeless 0 0 0  PHQ - 2 Score 0 0 0  Altered sleeping 0 0 0  Tired, decreased energy 0 0 0  Change in appetite 0 0 0  Feeling bad or failure about yourself  0 0 0  Trouble concentrating 0 0 0  Moving slowly or fidgety/restless 0 0 0  Suicidal thoughts 0 0 0  PHQ-9 Score 0 0 0  Difficult doing work/chores Not difficult at all Not difficult at all Not difficult at all    BP Readings from Last 3 Encounters:  03/26/22 108/72  09/20/21 122/72  03/22/21 135/84    Physical Exam Vitals and nursing note  reviewed.  Constitutional:      General: She is not in acute distress.    Appearance: She is well-developed.  HENT:     Head: Normocephalic and atraumatic.     Right Ear: Tympanic membrane and ear canal normal.     Left Ear: Tympanic membrane and ear canal normal.     Nose:     Right Sinus: No maxillary sinus tenderness.     Left Sinus: No maxillary sinus tenderness.  Eyes:     General: No scleral icterus.       Right eye: No discharge.        Left eye: No discharge.     Conjunctiva/sclera: Conjunctivae normal.  Neck:     Thyroid: No thyromegaly.     Vascular: No carotid bruit.  Cardiovascular:     Rate and Rhythm: Normal rate and regular rhythm.     Pulses: Normal pulses.     Heart sounds: Normal heart sounds.  Pulmonary:     Effort: Pulmonary effort is normal. No respiratory distress.     Breath sounds: No wheezing.  Abdominal:     General: Bowel sounds are normal.     Palpations: Abdomen is soft.     Tenderness: There is no abdominal tenderness.  Musculoskeletal:        General: No swelling. Normal range of motion.     Cervical back: Normal range of motion. No erythema.     Right lower leg: No edema.     Left lower leg: No edema.  Lymphadenopathy:     Cervical: No cervical adenopathy.  Skin:    General: Skin is warm and dry.     Findings: No rash.  Neurological:     Mental Status: She is alert and oriented to person, place, and time.     Cranial Nerves: No cranial nerve deficit.     Sensory: No sensory deficit.     Deep Tendon Reflexes: Reflexes are normal and symmetric.  Psychiatric:        Attention and Perception: Attention normal.        Mood and Affect: Mood normal.    Diabetic Foot Exam - Simple   Simple Foot Form Diabetic Foot exam was performed with the following findings:  Yes 03/26/2022  9:29 AM  Visual Inspection No deformities, no ulcerations, no other skin breakdown bilaterally: Yes Sensation Testing Intact to touch and monofilament testing  bilaterally: Yes Pulse Check Posterior Tibialis and Dorsalis pulse intact bilaterally: Yes Comments      Wt Readings from Last 3 Encounters:  03/26/22 171 lb (77.6 kg)  03/22/22 168 lb (76.2 kg)  09/20/21 168 lb (76.2 kg)    BP 108/72 (BP Location: Right Arm, Patient Position: Sitting, Cuff Size: Normal)   Pulse 85   Ht _0  (1.575 m)   Wt 171 lb (77.6 kg)   SpO2 97%   BMI 31.28 kg/m   Assessment and Plan: 1. Essential hypertension Clinically stable exam with well controlled BP. Tolerating medications without side effects at this time. Pt to continue current regimen and low sodium diet; benefits of regular exercise as able discussed. - CBC with Differential/Platelet - TSH - POCT urinalysis dipstick - lisinopril (ZESTRIL) 10 MG tablet; Take 1 tablet (10 mg total) by mouth daily.  Dispense: 90 tablet; Refill: 3  2. Hyperlipidemia associated with type 2 diabetes mellitus ([place]) On appropriate statin - check labs and advise - Lipid panel - atorvastatin (LIPITOR) 20 MG tablet; Take 1 tablet (20 mg total) by mouth daily.  Dispense: 90 tablet; Refill: 3  3. Type II diabetes mellitus with complication (HCC) Clinically stable by exam and report without s/s of hypoglycemia. DM complicated by hypertension and dyslipidemia. Tolerating medications well without side effects or other concerns. Eye exam UTD, foot exam normal - Comprehensive metabolic panel - Hemoglobin A1c - metFORMIN (GLUCOPHAGE) 500 MG tablet; TAKE 1/2 TABLET DAILY  Dispense: 45 tablet; Refill: 3  4. Stress incontinence of urine Continue Detrol - tolterodine (DETROL) 2 MG tablet; Take 1 tablet (2 mg total) by mouth 2 (two) times daily.  Dispense: 180 tablet; Refill: 3  5. Hiatal hernia with GERD without esophagitis Symptoms well controlled on daily PPI No red flag signs such as weight loss, n/v, melena Will continue omeprazole - omeprazole (PRILOSEC) 20 MG capsule; Take 1 capsule (20 mg total) by mouth 2 (two)  times daily before a meal.  Dispense: 180 capsule; Refill: 3   Partially dictated using Editor, commissioning. Any errors are unintentional.  Halina Maidens, MD [facility]  03/26/2022

## 2022-03-27 DIAGNOSIS — E785 Hyperlipidemia, unspecified: Secondary | ICD-10-CM | POA: Diagnosis not present

## 2022-03-27 DIAGNOSIS — I10 Essential (primary) hypertension: Secondary | ICD-10-CM | POA: Diagnosis not present

## 2022-03-27 DIAGNOSIS — E118 Type 2 diabetes mellitus with unspecified complications: Secondary | ICD-10-CM | POA: Diagnosis not present

## 2022-03-27 DIAGNOSIS — E1169 Type 2 diabetes mellitus with other specified complication: Secondary | ICD-10-CM | POA: Diagnosis not present

## 2022-03-28 LAB — LIPID PANEL
Chol/HDL Ratio: 4.4 ratio (ref 0.0–4.4)
Cholesterol, Total: 150 mg/dL (ref 100–199)
HDL: 34 mg/dL — ABNORMAL LOW (ref >39)
LDL Chol Calc (NIH): 88 mg/dL (ref 0–99)
Triglycerides: 157 mg/dL — ABNORMAL HIGH (ref 0–149)
VLDL Cholesterol Cal: 28 mg/dL (ref 5–40)

## 2022-03-28 LAB — CBC WITH DIFFERENTIAL/PLATELET
Basophils Absolute: 0 10*3/uL (ref 0.0–0.2)
Basos: 0 %
EOS (ABSOLUTE): 0.1 10*3/uL (ref 0.0–0.4)
Eos: 2 %
Hematocrit: 41.2 % (ref 34.0–46.6)
Hemoglobin: 13.6 g/dL (ref 11.1–15.9)
Immature Grans (Abs): 0 10*3/uL (ref 0.0–0.1)
Immature Granulocytes: 0 %
Lymphocytes Absolute: 2 10*3/uL (ref 0.7–3.1)
Lymphs: 38 %
MCH: 27.9 pg (ref 26.6–33.0)
MCHC: 33 g/dL (ref 31.5–35.7)
MCV: 85 fL (ref 79–97)
Monocytes Absolute: 0.5 10*3/uL (ref 0.1–0.9)
Monocytes: 10 %
Neutrophils Absolute: 2.7 10*3/uL (ref 1.4–7.0)
Neutrophils: 50 %
Platelets: 214 10*3/uL (ref 150–450)
RBC: 4.87 x10E6/uL (ref 3.77–5.28)
RDW: 13.7 % (ref 11.7–15.4)
WBC: 5.3 10*3/uL (ref 3.4–10.8)

## 2022-03-28 LAB — COMPREHENSIVE METABOLIC PANEL
ALT: 18 IU/L (ref 0–32)
AST: 15 IU/L (ref 0–40)
Albumin/Globulin Ratio: 1.8 (ref 1.2–2.2)
Albumin: 4.2 g/dL (ref 3.9–4.9)
Alkaline Phosphatase: 86 IU/L (ref 44–121)
BUN/Creatinine Ratio: 26 (ref 12–28)
BUN: 23 mg/dL (ref 8–27)
Bilirubin Total: 0.4 mg/dL (ref 0.0–1.2)
CO2: 23 mmol/L (ref 20–29)
Calcium: 9.4 mg/dL (ref 8.7–10.3)
Chloride: 106 mmol/L (ref 96–106)
Creatinine, Ser: 0.89 mg/dL (ref 0.57–1.00)
Globulin, Total: 2.4 g/dL (ref 1.5–4.5)
Glucose: 123 mg/dL — ABNORMAL HIGH (ref 70–99)
Potassium: 4.5 mmol/L (ref 3.5–5.2)
Sodium: 144 mmol/L (ref 134–144)
Total Protein: 6.6 g/dL (ref 6.0–8.5)
eGFR: 70 mL/min/{1.73_m2} (ref >59)

## 2022-03-28 LAB — HEMOGLOBIN A1C
Est. average glucose Bld gHb Est-mCnc: 143 mg/dL
Hgb A1c MFr Bld: 6.6 % — ABNORMAL HIGH (ref 4.8–5.6)

## 2022-03-28 LAB — TSH: TSH: 2.41 u[IU]/mL (ref 0.450–4.500)

## 2022-03-28 NOTE — Progress Notes (Signed)
PC to pt, discussed lab results. Will call back with any questions or concerns.

## 2022-04-25 ENCOUNTER — Other Ambulatory Visit: Payer: Self-pay | Admitting: Internal Medicine

## 2022-04-25 DIAGNOSIS — E1169 Type 2 diabetes mellitus with other specified complication: Secondary | ICD-10-CM

## 2022-04-26 NOTE — Telephone Encounter (Signed)
Refused Lipitor 20 mg because it went to [facility].   This is from [id number].

## 2022-05-02 ENCOUNTER — Telehealth: Payer: Medicare Other | Admitting: Physician Assistant

## 2022-05-02 DIAGNOSIS — R3989 Other symptoms and signs involving the genitourinary system: Secondary | ICD-10-CM

## 2022-05-02 MED ORDER — CEPHALEXIN 500 MG PO CAPS: 500.0000 mg | ORAL_CAPSULE | Freq: Two times a day (BID) | ORAL | 0 refills | Status: AC

## 2022-05-02 NOTE — Progress Notes (Signed)
I have spent 5 minutes in review of e-visit questionnaire, review and updating patient chart, medical decision making and response to patient.   Kamyiah Colantonio Cody Clarann Helvey, PA-C    

## 2022-05-02 NOTE — Progress Notes (Signed)

## 2022-07-04 DIAGNOSIS — Z86018 Personal history of other benign neoplasm: Secondary | ICD-10-CM | POA: Diagnosis not present

## 2022-07-04 DIAGNOSIS — L578 Other skin changes due to chronic exposure to nonionizing radiation: Secondary | ICD-10-CM | POA: Diagnosis not present

## 2022-07-30 ENCOUNTER — Encounter: Payer: Self-pay | Admitting: Internal Medicine

## 2022-07-30 ENCOUNTER — Ambulatory Visit (INDEPENDENT_AMBULATORY_CARE_PROVIDER_SITE_OTHER): Payer: Medicare Other | Admitting: Internal Medicine

## 2022-07-30 VITALS — BP 128/74 | HR 88 | Ht 62.0 in | Wt 169.0 lb

## 2022-07-30 DIAGNOSIS — E785 Hyperlipidemia, unspecified: Secondary | ICD-10-CM | POA: Diagnosis not present

## 2022-07-30 DIAGNOSIS — E118 Type 2 diabetes mellitus with unspecified complications: Secondary | ICD-10-CM | POA: Diagnosis not present

## 2022-07-30 DIAGNOSIS — E1169 Type 2 diabetes mellitus with other specified complication: Secondary | ICD-10-CM | POA: Diagnosis not present

## 2022-07-30 DIAGNOSIS — I10 Essential (primary) hypertension: Secondary | ICD-10-CM

## 2022-07-30 MED ORDER — ATORVASTATIN CALCIUM 40 MG PO TABS: 40.0000 mg | ORAL_TABLET | Freq: Every day | ORAL | 1 refills | Status: DC

## 2022-07-30 NOTE — Progress Notes (Signed)
Date:  07/30/2022   Name:  Pamela Rosario   DOB:  Oct 23, 1950   MRN:  [id number]   Chief Complaint: Hypertension and Diabetes  Diabetes She presents for her follow-up diabetic visit. She has type 2 diabetes mellitus. Her disease course has been stable. Pertinent negatives for hypoglycemia include no dizziness or headaches. Pertinent negatives for diabetes include no chest pain, no fatigue and no weakness. Symptoms are stable. Current diabetic treatment includes oral agent (monotherapy).  Hyperlipidemia This is a chronic problem. The problem is uncontrolled. Pertinent negatives include no chest pain or shortness of breath. Current antihyperlipidemic treatment includes statins (dose increased).    Lab Results  Component Value Date   NA 144 03/27/2022   K 4.5 03/27/2022   CO2 23 03/27/2022   GLUCOSE 123 (H) 03/27/2022   BUN 23 03/27/2022   CREATININE 0.89 03/27/2022   CALCIUM 9.4 03/27/2022   EGFR 70 03/27/2022   GFRNONAA 67 03/06/2020   Lab Results  Component Value Date   CHOL 150 03/27/2022   HDL 34 (L) 03/27/2022   LDLCALC 88 03/27/2022   TRIG 157 (H) 03/27/2022   CHOLHDL 4.4 03/27/2022   Lab Results  Component Value Date   TSH 2.410 03/27/2022   Lab Results  Component Value Date   HGBA1C 6.6 (H) 03/27/2022   Lab Results  Component Value Date   WBC 5.3 03/27/2022   HGB 13.6 03/27/2022   HCT 41.2 03/27/2022   MCV 85 03/27/2022   PLT 214 03/27/2022   Lab Results  Component Value Date   ALT 18 03/27/2022   AST 15 03/27/2022   ALKPHOS 86 03/27/2022   BILITOT 0.4 03/27/2022   No results found for: "25OHVITD2", "25OHVITD3", "VD25OH"   Review of Systems  Constitutional:  Negative for fatigue and unexpected weight change.  HENT:  Negative for nosebleeds.   Eyes:  Negative for visual disturbance.  Respiratory:  Negative for cough, chest tightness, shortness of breath and wheezing.   Cardiovascular:  Negative for chest pain, palpitations and leg swelling.   Gastrointestinal:  Positive for abdominal pain (intermittent epigastric discomfort). Negative for constipation and diarrhea.  Neurological:  Negative for dizziness, weakness, light-headedness and headaches.    Patient Active Problem List   Diagnosis Date Noted   Personal history of colonic polyps    Polyp of sigmoid colon    Stress incontinence of urine 02/25/2017   Hiatal hernia with GERD without esophagitis 09/06/2016   Fibrocystic breast changes of both breasts 09/06/2016   Intraductal papilloma of breast, left 09/06/2016   Hyperlipidemia associated with type 2 diabetes mellitus ([place]) 09/06/2016   Essential hypertension 09/06/2016   Type II diabetes mellitus with complication ([place]) [id number]   Adenomatous colon polyp 09/06/2016    Allergies  Allergen Reactions   Demerol [Meperidine] Hives   Tetracyclines & Related Hives   Simvastatin Other (See Comments)    Muscle pain    Past Surgical History:  Procedure Laterality Date   brachial cleft cyst excison ( Right Neck )  1971   BREAST EXCISIONAL BIOPSY Left    intraductal papilloma   BREAST SURGERY  2007   L intraductal papilloma excision   [place]   emergency   COLONOSCOPY  08/24/2013   tubular adenoma   COLONOSCOPY WITH PROPOFOL N/A 02/05/2019   Procedure: COLONOSCOPY WITH BIOPSIES;  Surgeon: Lucilla Lame, MD;  Location: [place];  Service: Endoscopy;  Laterality: N/A;  Diabetic - oral meds   ESOPHAGOGASTRODUODENOSCOPY  2005  hiatal hernia & polyp found   EXCISION / BIOPSY BREAST / NIPPLE / DUCT Left 2005   LARYNGOSCOPY  2006   left intraductal papilloma excision of left breast  2007   POLYPECTOMY N/A 02/05/2019   Procedure: POLYPECTOMY;  Surgeon: Lucilla Lame, MD;  Location: [place];  Service: Endoscopy;  Laterality: N/A;   TONSILECTOMY, ADENOIDECTOMY, BILATERAL MYRINGOTOMY AND TUBES      Social History   Tobacco Use   Smoking status: Never   Smokeless tobacco: Never    Tobacco comments:    smoking cessation materials not required  Vaping Use   Vaping Use: Never used  Substance Use Topics   Alcohol use: No   Drug use: No     Medication list has been reviewed and updated.  Current Meds  Medication Sig   glucose blood test strip 1 each by Other route as needed for other. Use as instructed   levocetirizine (XYZAL) 5 MG tablet Take 5 mg by mouth every evening. PRN   lisinopril (ZESTRIL) 10 MG tablet Take 1 tablet (10 mg total) by mouth daily.   metFORMIN (GLUCOPHAGE) 500 MG tablet TAKE 1/2 TABLET DAILY   omeprazole (PRILOSEC) 20 MG capsule Take 1 capsule (20 mg total) by mouth 2 (two) times daily before a meal.   tolterodine (DETROL) 2 MG tablet Take 1 tablet (2 mg total) by mouth 2 (two) times daily.   [DISCONTINUED] atorvastatin (LIPITOR) 20 MG tablet Take 1 tablet (20 mg total) by mouth daily.       07/30/2022    8:26 AM 03/26/2022    9:22 AM 09/20/2021    9:45 AM 08/28/2020    2:51 PM  GAD 7 : Generalized Anxiety Score  Nervous, Anxious, on Edge 0 0 0 0  Control/stop worrying 0 0 0 0  Worry too much - different things 0 0 0 0  Trouble relaxing 0 0 0 0  Restless 0 0 0 0  Easily annoyed or irritable 0 0 0 0  Afraid - awful might happen 0 0 0 0  Total GAD 7 Score 0 0 0 0  Anxiety Difficulty Not difficult at all Not difficult at all Not difficult at all Not difficult at all       07/30/2022    8:26 AM 03/26/2022    9:22 AM 03/22/2022   11:09 AM  Depression screen PHQ 2/9  Decreased Interest 0 0 0  Down, Depressed, Hopeless 0 0 0  PHQ - 2 Score 0 0 0  Altered sleeping 0 0 0  Tired, decreased energy 0 0 0  Change in appetite 0 0 0  Feeling bad or failure about yourself  0 0 0  Trouble concentrating 0 0 0  Moving slowly or fidgety/restless 0 0 0  Suicidal thoughts 0 0 0  PHQ-9 Score 0 0 0  Difficult doing work/chores Not difficult at all Not difficult at all Not difficult at all    BP Readings from Last 3 Encounters:  07/30/22  128/74  03/26/22 108/72  09/20/21 122/72    Physical Exam Vitals and nursing note reviewed.  Constitutional:      General: She is not in acute distress.    Appearance: Normal appearance. She is well-developed.  HENT:     Head: Normocephalic and atraumatic.  Neck:     Vascular: No carotid bruit.  Cardiovascular:     Rate and Rhythm: Normal rate and regular rhythm.     Pulses: Normal pulses.  Pulmonary:  Effort: Pulmonary effort is normal. No respiratory distress.     Breath sounds: No wheezing or rhonchi.  Musculoskeletal:     Cervical back: Normal range of motion.     Right lower leg: No edema.     Left lower leg: No edema.  Lymphadenopathy:     Cervical: No cervical adenopathy.  Skin:    General: Skin is warm and dry.     Findings: No rash.  Neurological:     Mental Status: She is alert and oriented to person, place, and time.  Psychiatric:        Mood and Affect: Mood normal.        Behavior: Behavior normal.     Wt Readings from Last 3 Encounters:  07/30/22 169 lb (76.7 kg)  03/26/22 171 lb (77.6 kg)  03/22/22 168 lb (76.2 kg)    BP 128/74   Pulse 88   Ht '5\' 2"'$  (1.575 m)   Wt 169 lb (76.7 kg)   SpO2 95%   BMI 30.91 kg/m   Assessment and Plan: Problem List Items Addressed This Visit       Cardiovascular and Mediastinum   Essential hypertension (Chronic)    Clinically stable exam with well controlled BP on lisinopril. Tolerating medications without side effects. Pt to continue current regimen and low sodium diet.       Relevant Medications   atorvastatin (LIPITOR) 40 MG tablet   Other Relevant Orders   Comprehensive metabolic panel     Endocrine   Hyperlipidemia associated with type 2 diabetes mellitus (HCC) (Chronic)    Tolerating statin medications without concerns LDL was 88 last check; atorvastatin increased to 40 mg. Will recheck labs today        Relevant Medications   atorvastatin (LIPITOR) 40 MG tablet   Other Relevant Orders    Comprehensive metabolic panel   Lipid panel   Type II diabetes mellitus with complication (HCC) - Primary (Chronic)    Clinically stable without s/s of hypoglycemia. Tolerating metformin well without side effects or other concerns. Lab Results  Component Value Date   HGBA1C 6.6 (H) 03/27/2022        Relevant Medications   atorvastatin (LIPITOR) 40 MG tablet   Other Relevant Orders   Comprehensive metabolic panel   Hemoglobin A1c     Partially dictated using Editor, commissioning. Any errors are unintentional.  Halina Maidens, MD [facility]  07/30/2022

## 2022-07-30 NOTE — Assessment & Plan Note (Signed)
Clinically stable exam with well controlled BP on lisinopril. Tolerating medications without side effects. Pt to continue current regimen and low sodium diet.

## 2022-07-30 NOTE — Assessment & Plan Note (Signed)
Clinically stable without s/s of hypoglycemia. Tolerating metformin well without side effects or other concerns. Lab Results  Component Value Date   HGBA1C 6.6 (H) 03/27/2022

## 2022-07-30 NOTE — Assessment & Plan Note (Signed)
Tolerating statin medications without concerns LDL was 88 last check; atorvastatin increased to 40 mg. Will recheck labs today

## 2022-07-31 DIAGNOSIS — E118 Type 2 diabetes mellitus with unspecified complications: Secondary | ICD-10-CM | POA: Diagnosis not present

## 2022-07-31 DIAGNOSIS — I10 Essential (primary) hypertension: Secondary | ICD-10-CM | POA: Diagnosis not present

## 2022-07-31 DIAGNOSIS — E1169 Type 2 diabetes mellitus with other specified complication: Secondary | ICD-10-CM | POA: Diagnosis not present

## 2022-07-31 DIAGNOSIS — E785 Hyperlipidemia, unspecified: Secondary | ICD-10-CM | POA: Diagnosis not present

## 2022-08-01 LAB — COMPREHENSIVE METABOLIC PANEL
ALT: 19 IU/L (ref 0–32)
AST: 16 IU/L (ref 0–40)
Albumin/Globulin Ratio: 1.8 (ref 1.2–2.2)
Albumin: 4.4 g/dL (ref 3.9–4.9)
Alkaline Phosphatase: 93 IU/L (ref 44–121)
BUN/Creatinine Ratio: 27 (ref 12–28)
BUN: 26 mg/dL (ref 8–27)
Bilirubin Total: 0.4 mg/dL (ref 0.0–1.2)
CO2: 22 mmol/L (ref 20–29)
Calcium: 9.6 mg/dL (ref 8.7–10.3)
Chloride: 102 mmol/L (ref 96–106)
Creatinine, Ser: 0.96 mg/dL (ref 0.57–1.00)
Globulin, Total: 2.5 g/dL (ref 1.5–4.5)
Glucose: 124 mg/dL — ABNORMAL HIGH (ref 70–99)
Potassium: 4.6 mmol/L (ref 3.5–5.2)
Sodium: 139 mmol/L (ref 134–144)
Total Protein: 6.9 g/dL (ref 6.0–8.5)
eGFR: 64 mL/min/{1.73_m2} (ref >59)

## 2022-08-01 LAB — LIPID PANEL
Chol/HDL Ratio: 4.2 ratio (ref 0.0–4.4)
Cholesterol, Total: 155 mg/dL (ref 100–199)
HDL: 37 mg/dL — ABNORMAL LOW (ref >39)
LDL Chol Calc (NIH): 94 mg/dL (ref 0–99)
Triglycerides: 131 mg/dL (ref 0–149)
VLDL Cholesterol Cal: 24 mg/dL (ref 5–40)

## 2022-08-01 LAB — HEMOGLOBIN A1C
Est. average glucose Bld gHb Est-mCnc: 148 mg/dL
Hgb A1c MFr Bld: 6.8 % — ABNORMAL HIGH (ref 4.8–5.6)

## 2022-09-16 DIAGNOSIS — H2513 Age-related nuclear cataract, bilateral: Secondary | ICD-10-CM | POA: Diagnosis not present

## 2022-09-16 DIAGNOSIS — H53001 Unspecified amblyopia, right eye: Secondary | ICD-10-CM | POA: Diagnosis not present

## 2022-09-16 DIAGNOSIS — E119 Type 2 diabetes mellitus without complications: Secondary | ICD-10-CM | POA: Diagnosis not present

## 2022-09-16 DIAGNOSIS — H43813 Vitreous degeneration, bilateral: Secondary | ICD-10-CM | POA: Diagnosis not present

## 2022-09-16 LAB — HM DIABETES EYE EXAM

## 2022-10-06 ENCOUNTER — Other Ambulatory Visit: Payer: Self-pay | Admitting: Internal Medicine

## 2022-10-06 DIAGNOSIS — E118 Type 2 diabetes mellitus with unspecified complications: Secondary | ICD-10-CM

## 2022-10-07 NOTE — Telephone Encounter (Signed)
Requested Prescriptions  Refused Prescriptions Disp Refills   metFORMIN (GLUCOPHAGE) 500 MG tablet [Pharmacy Med Name: METFORMIN TAB 500MG ] 45 tablet 3    Sig: TAKE 1/2 TABLET DAILY     Endocrinology:  Diabetes - Biguanides Failed - 10/06/2022  8:14 AM      Failed - B12 Level in normal range and within 720 days    No results found for: "VITAMINB12"       Passed - Cr in normal range and within 360 days    Creatinine, Ser  Date Value Ref Range Status  07/31/2022 0.96 0.57 - 1.00 mg/dL Final         Passed - HBA1C is between 0 and 7.9 and within 180 days    Hgb A1c MFr Bld  Date Value Ref Range Status  07/31/2022 6.8 (H) 4.8 - 5.6 % Final    Comment:             Prediabetes: 5.7 - 6.4          Diabetes: >6.4          Glycemic control for adults with diabetes: <7.0          Passed - eGFR in normal range and within 360 days    GFR calc Af Amer  Date Value Ref Range Status  03/06/2020 77 >59 mL/min/1.73 Final    Comment:    **[facility] currently reports eGFR in compliance with the current**   recommendations of the [employer]. [facility] will   update reporting as new guidelines are published from the NKF-ASN   Task force.    GFR calc non Af Amer  Date Value Ref Range Status  03/06/2020 67 >59 mL/min/1.73 Final   eGFR  Date Value Ref Range Status  07/31/2022 64 >59 mL/min/1.73 Final         Passed - Valid encounter within last 6 months    Recent Outpatient Visits           2 months ago Type II diabetes mellitus with complication [facility])   [place] [facility] at [facility], Nyoka Cowden, MD   6 months ago Essential hypertension   [place] [facility] at [facility], Nyoka Cowden, MD   1 year ago Essential hypertension   [place] [facility] at [facility], Nyoka Cowden, MD   1 year ago Essential hypertension   [place] [facility] at [facility], Nyoka Cowden, MD   2 years ago Type II diabetes mellitus with complication [facility])    Primary Care & Sports Medicine at [facility], Nyoka Cowden, MD       Future Appointments             In 1 month Judithann Graves Nyoka Cowden, MD [facility] Primary Care & [facility] Medicine at [facility], [facility]   In 5 months Reubin Milan, MD [facility] & Sports Medicine at [facility]            Passed - CBC within normal limits and completed in the last 12 months    WBC  Date Value Ref Range Status  03/27/2022 5.3 3.4 - 10.8 x10E3/uL Final   RBC  Date Value Ref Range Status  03/27/2022 4.87 3.77 - 5.28 x10E6/uL Final  03/18/2018 0 (A) 4.04 - 5.48 M/uL Final   Hemoglobin  Date Value Ref Range Status  03/27/2022 13.6 11.1 - 15.9 g/dL Final   Hematocrit  Date Value Ref Range Status  03/27/2022 41.2 34.0 - 46.6 % Final   MCHC  Date Value Ref Range Status  03/27/2022 33.0 31.5 - 35.7 g/dL Final   [facility]  Date Value Ref Range Status  03/27/2022 27.9 26.6 - 33.0 pg Final   MCV  Date Value Ref Range Status  03/27/2022 85 79 - 97 fL Final   No results found for: "PLTCOUNTKUC", "LABPLAT", "POCPLA" RDW  Date Value Ref Range Status  03/27/2022 13.7 11.7 - 15.4 % Final

## 2022-10-30 ENCOUNTER — Encounter: Payer: Self-pay | Admitting: Internal Medicine

## 2022-10-30 ENCOUNTER — Other Ambulatory Visit: Payer: Self-pay | Admitting: Internal Medicine

## 2022-10-30 DIAGNOSIS — E118 Type 2 diabetes mellitus with unspecified complications: Secondary | ICD-10-CM

## 2022-10-30 MED ORDER — METFORMIN HCL 500 MG PO TABS: ORAL_TABLET | ORAL | 3 refills | Status: DC

## 2022-10-31 ENCOUNTER — Other Ambulatory Visit: Payer: Self-pay

## 2022-10-31 DIAGNOSIS — E118 Type 2 diabetes mellitus with unspecified complications: Secondary | ICD-10-CM

## 2022-10-31 MED ORDER — METFORMIN HCL 500 MG PO TABS: ORAL_TABLET | ORAL | 1 refills | Status: DC

## 2022-11-15 IMAGING — MG MM DIGITAL SCREENING BILAT W/ TOMO AND CAD
8 series · 8 of 24 positions shown · non-contrast
Comparison: Previous exam(s).

CLINICAL DATA: Screening.

EXAM:
DIGITAL SCREENING BILATERAL MAMMOGRAM WITH TOMOSYNTHESIS AND CAD
TECHNIQUE: Bilateral screening digital craniocaudal and mediolateral oblique
mammograms were obtained. Bilateral screening digital breast
tomosynthesis was performed. The images were evaluated with
computer-aided detection.

[R MLO synth-2D]
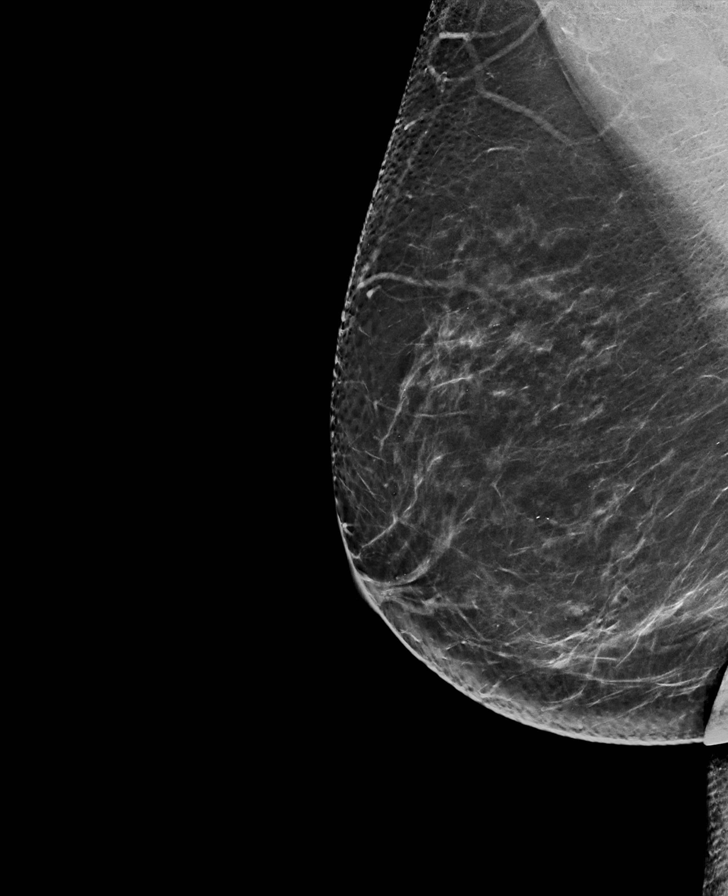

[L MLO synth-2D]
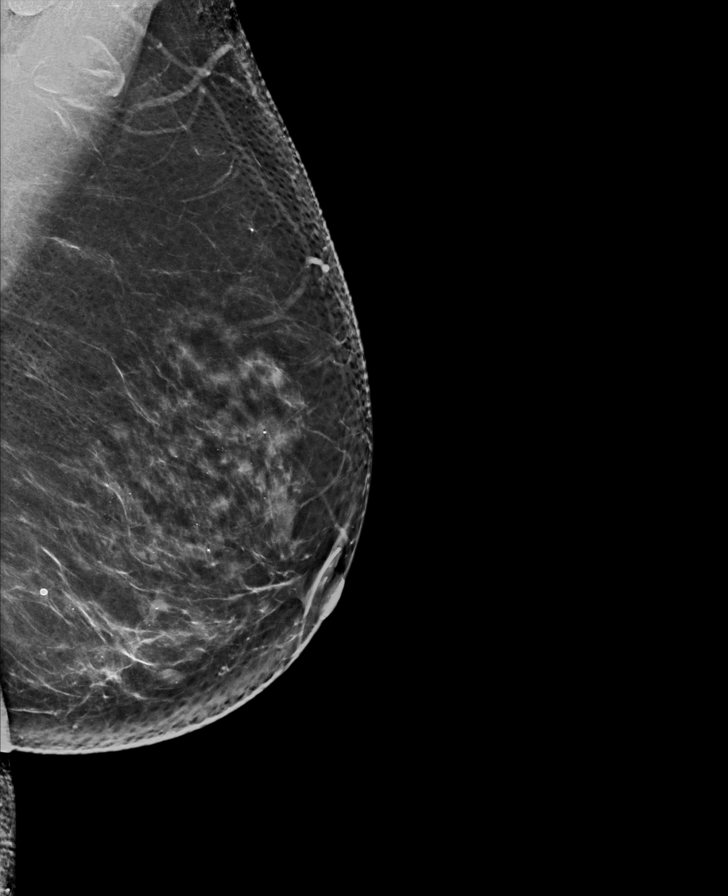

[R CC synth-2D]
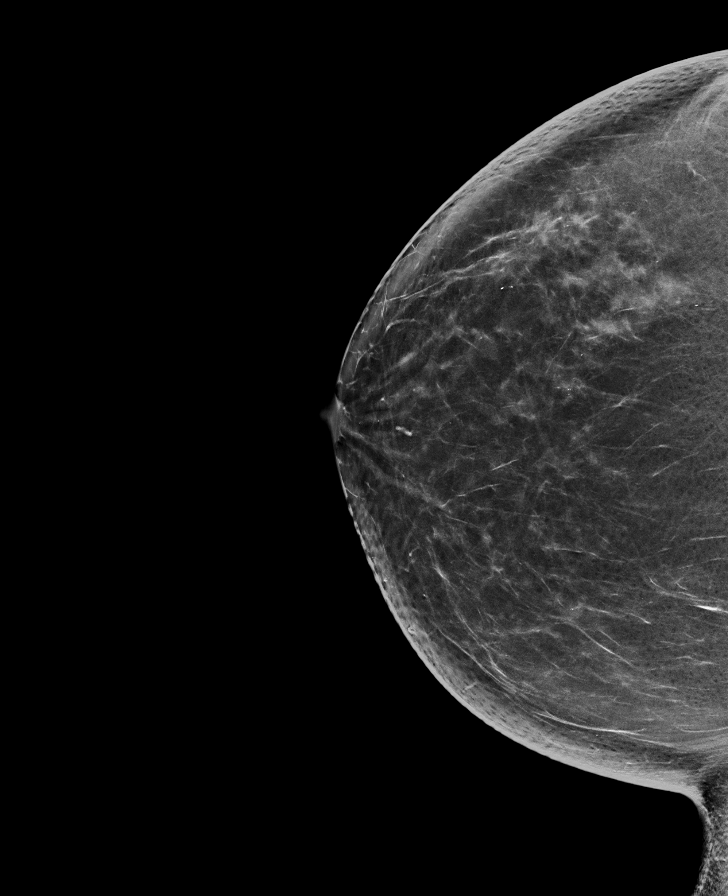

[L CC synth-2D]
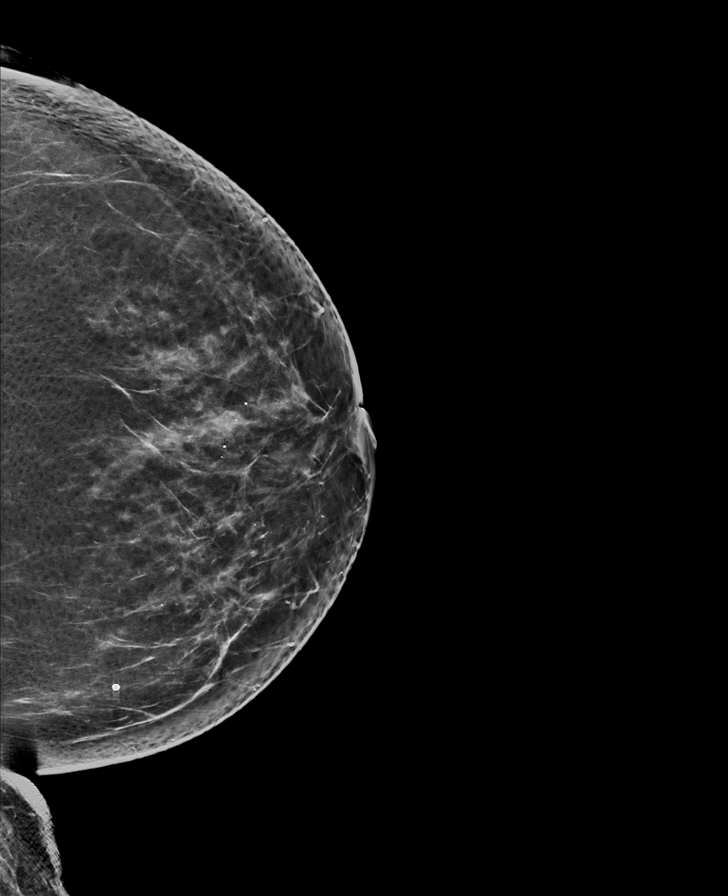

[R MLO tomo · tomo slice 43/86.0]
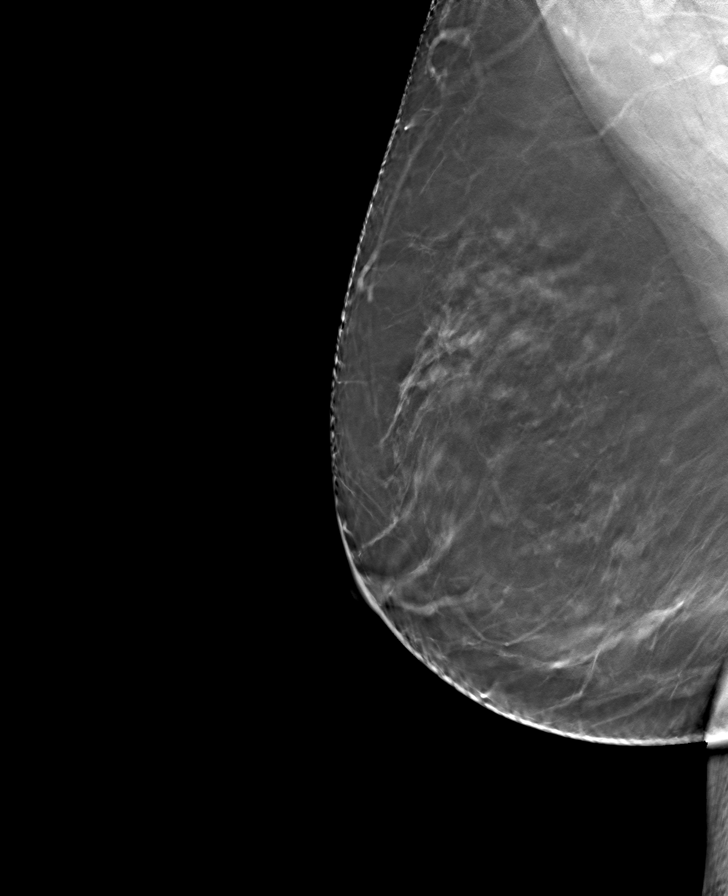

[L MLO tomo · tomo slice 41/82.0]
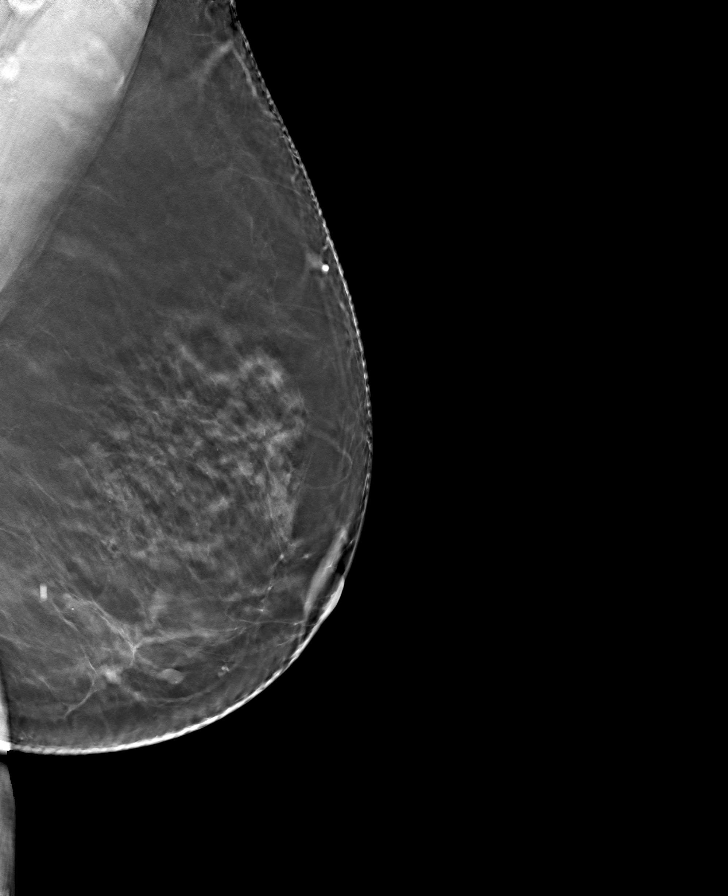

[R CC tomo · tomo slice 43/86.0]
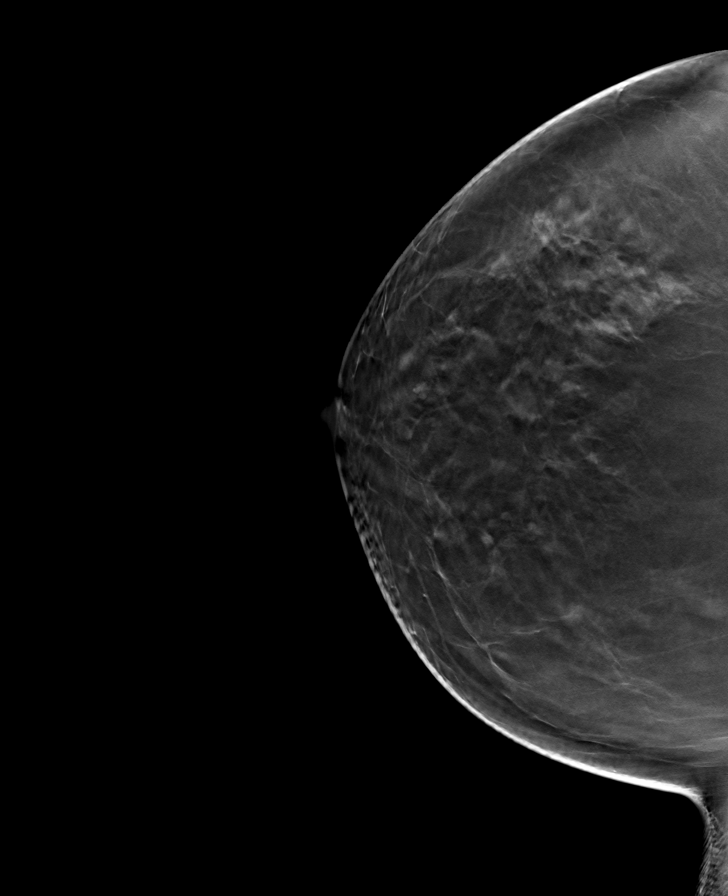

[L CC tomo · tomo slice 40/79.0]
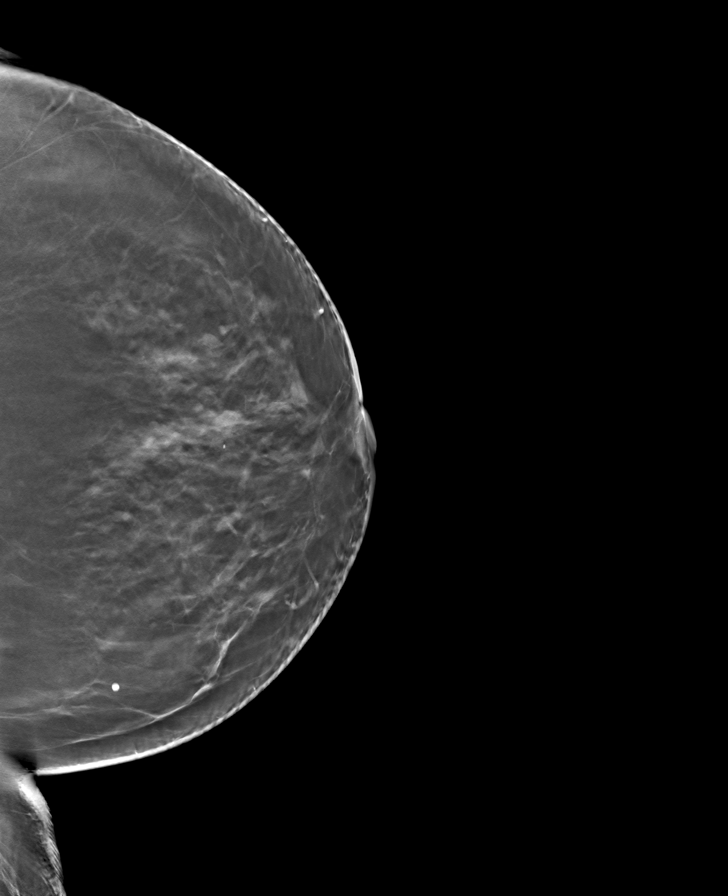

[8 of 24 positions shown; findings below may reference images not displayed]

ACR Breast Density Category b: There are scattered areas of
fibroglandular density.
FINDINGS: There are no findings suspicious for malignancy.
IMPRESSION: No mammographic evidence of malignancy. A result letter of this
screening mammogram will be mailed directly to the patient.

RECOMMENDATION:
Screening mammogram in one year. (Code:51-O-LD2)

BI-RADS CATEGORY  1: Negative.

## 2022-12-02 ENCOUNTER — Other Ambulatory Visit: Payer: Self-pay | Admitting: Internal Medicine

## 2022-12-02 ENCOUNTER — Ambulatory Visit (INDEPENDENT_AMBULATORY_CARE_PROVIDER_SITE_OTHER): Payer: Medicare Other | Admitting: Internal Medicine

## 2022-12-02 ENCOUNTER — Encounter: Payer: Self-pay | Admitting: Internal Medicine

## 2022-12-02 VITALS — BP 110/60 | HR 87 | Ht 62.0 in | Wt 169.4 lb

## 2022-12-02 DIAGNOSIS — E785 Hyperlipidemia, unspecified: Secondary | ICD-10-CM | POA: Diagnosis not present

## 2022-12-02 DIAGNOSIS — Z7984 Long term (current) use of oral hypoglycemic drugs: Secondary | ICD-10-CM | POA: Diagnosis not present

## 2022-12-02 DIAGNOSIS — I10 Essential (primary) hypertension: Secondary | ICD-10-CM

## 2022-12-02 DIAGNOSIS — E1169 Type 2 diabetes mellitus with other specified complication: Secondary | ICD-10-CM | POA: Diagnosis not present

## 2022-12-02 DIAGNOSIS — E118 Type 2 diabetes mellitus with unspecified complications: Secondary | ICD-10-CM | POA: Diagnosis not present

## 2022-12-02 DIAGNOSIS — Z1231 Encounter for screening mammogram for malignant neoplasm of breast: Secondary | ICD-10-CM

## 2022-12-02 DIAGNOSIS — K219 Gastro-esophageal reflux disease without esophagitis: Secondary | ICD-10-CM | POA: Diagnosis not present

## 2022-12-02 DIAGNOSIS — K449 Diaphragmatic hernia without obstruction or gangrene: Secondary | ICD-10-CM | POA: Diagnosis not present

## 2022-12-02 MED ORDER — ATORVASTATIN CALCIUM 40 MG PO TABS: 40.0000 mg | ORAL_TABLET | Freq: Every day | ORAL | 1 refills | Status: DC

## 2022-12-02 NOTE — Assessment & Plan Note (Signed)
Blood sugars stable without hypoglycemic symptoms or events. Currently being treated with metformin. Lab Results  Component Value Date   HGBA1C 6.8 (H) 07/31/2022

## 2022-12-02 NOTE — Assessment & Plan Note (Signed)
Recent symptoms could be due to [facility] Avoid eating within 4 hours of bed Eliminate gluten as a possible trigger Elevate HOB if needed Continue PPI daily

## 2022-12-02 NOTE — Patient Instructions (Signed)
Call [facility] to schedule your mammogram at [phone].  

## 2022-12-02 NOTE — Progress Notes (Signed)
Date:  12/02/2022   Name:  Pamela Rosario   DOB:  20-Oct-1950   MRN:  [id number]   Chief Complaint: Diabetes  Diabetes She presents for her follow-up diabetic visit. She has type 2 diabetes mellitus. Her disease course has been stable. Pertinent negatives for hypoglycemia include no headaches or tremors. Pertinent negatives for diabetes include no chest pain, no fatigue, no polydipsia and no polyuria. Current diabetic treatment includes oral agent (monotherapy). She is compliant with treatment all of the time.  Hypertension This is a chronic problem. The problem is controlled. Pertinent negatives include no chest pain, headaches, palpitations or shortness of breath. Past treatments include ACE inhibitors.  Abdominal Pain This is a new problem. The current episode started in the past 7 days. The problem has been waxing and waning. The pain is located in the epigastric region. The pain is mild. The quality of the pain is cramping and a sensation of fullness. The abdominal pain does not radiate. Pertinent negatives include no arthralgias, constipation, diarrhea, dysuria, fever, headaches, hematuria, nausea or vomiting. Exacerbated by: possible pizza and bagels.    Lab Results  Component Value Date   NA 139 07/31/2022   K 4.6 07/31/2022   CO2 22 07/31/2022   GLUCOSE 124 (H) 07/31/2022   BUN 26 07/31/2022   CREATININE 0.96 07/31/2022   CALCIUM 9.6 07/31/2022   EGFR 64 07/31/2022   GFRNONAA 67 03/06/2020   Lab Results  Component Value Date   CHOL 155 07/31/2022   HDL 37 (L) 07/31/2022   LDLCALC 94 07/31/2022   TRIG 131 07/31/2022   CHOLHDL 4.2 07/31/2022   Lab Results  Component Value Date   TSH 2.410 03/27/2022   Lab Results  Component Value Date   HGBA1C 6.8 (H) 07/31/2022   Lab Results  Component Value Date   WBC 5.3 03/27/2022   HGB 13.6 03/27/2022   HCT 41.2 03/27/2022   MCV 85 03/27/2022   PLT 214 03/27/2022   Lab Results  Component Value Date   ALT 19 07/31/2022    AST 16 07/31/2022   ALKPHOS 93 07/31/2022   BILITOT 0.4 07/31/2022   No results found for: "25OHVITD2", "25OHVITD3", "VD25OH"   Review of Systems  Constitutional:  Negative for appetite change, fatigue, fever and unexpected weight change.  HENT:  Negative for tinnitus and trouble swallowing.   Eyes:  Negative for visual disturbance.  Respiratory:  Negative for cough, chest tightness and shortness of breath.   Cardiovascular:  Negative for chest pain, palpitations and leg swelling.  Gastrointestinal:  Positive for abdominal pain. Negative for blood in stool, constipation, diarrhea, nausea and vomiting.  Endocrine: Negative for polydipsia and polyuria.  Genitourinary:  Negative for dysuria and hematuria.  Musculoskeletal:  Negative for arthralgias.  Neurological:  Negative for tremors, numbness and headaches.  Psychiatric/Behavioral:  Negative for dysphoric mood.     Patient Active Problem List   Diagnosis Date Noted   Personal history of colonic polyps    Polyp of sigmoid colon    Stress incontinence of urine 02/25/2017   Hiatal hernia with GERD without esophagitis 09/06/2016   Fibrocystic breast changes of both breasts 09/06/2016   Intraductal papilloma of breast, left 09/06/2016   Hyperlipidemia associated with type 2 diabetes mellitus (HCC) 09/06/2016   Essential hypertension 09/06/2016   Type II diabetes mellitus with complication (HCC) 09/06/2016   Adenomatous colon polyp 09/06/2016    Allergies  Allergen Reactions   Demerol [Meperidine] Hives   Tetracyclines & Related Hives  Simvastatin Other (See Comments)    Muscle pain    Past Surgical History:  Procedure Laterality Date   brachial cleft cyst excison ( Right Neck )  1971   BREAST EXCISIONAL BIOPSY Left    intraductal papilloma   BREAST SURGERY  2007   L intraductal papilloma excision   CESAREAN SECTION  1990   emergency   COLONOSCOPY  08/24/2013   tubular adenoma   COLONOSCOPY WITH PROPOFOL N/A  02/05/2019   Procedure: COLONOSCOPY WITH BIOPSIES;  Surgeon: Midge Minium, MD;  Location: [facility];  Service: Endoscopy;  Laterality: N/A;  Diabetic - oral meds   ESOPHAGOGASTRODUODENOSCOPY  2005   hiatal hernia & polyp found   EXCISION / BIOPSY BREAST / NIPPLE / DUCT Left 2005   LARYNGOSCOPY  2006   left intraductal papilloma excision of left breast  2007   POLYPECTOMY N/A 02/05/2019   Procedure: POLYPECTOMY;  Surgeon: Midge Minium, MD;  Location: [facility];  Service: Endoscopy;  Laterality: N/A;   TONSILECTOMY, ADENOIDECTOMY, BILATERAL MYRINGOTOMY AND TUBES      Social History   Tobacco Use   Smoking status: Never   Smokeless tobacco: Never   Tobacco comments:    smoking cessation materials not required  Vaping Use   Vaping Use: Never used  Substance Use Topics   Alcohol use: No   Drug use: No     Medication list has been reviewed and updated.  Current Meds  Medication Sig   glucose blood test strip 1 each by Other route as needed for other. Use as instructed   levocetirizine (XYZAL) 5 MG tablet Take 5 mg by mouth every evening. PRN   lisinopril (ZESTRIL) 10 MG tablet Take 1 tablet (10 mg total) by mouth daily.   metFORMIN (GLUCOPHAGE) 500 MG tablet TAKE 1/2 TABLET DAILY   omeprazole (PRILOSEC) 20 MG capsule Take 1 capsule (20 mg total) by mouth 2 (two) times daily before a meal.   tolterodine (DETROL) 2 MG tablet Take 1 tablet (2 mg total) by mouth 2 (two) times daily.   [DISCONTINUED] atorvastatin (LIPITOR) 40 MG tablet Take 1 tablet (40 mg total) by mouth daily. (Patient taking differently: Take 20 mg by mouth daily.)       12/02/2022   10:16 AM 07/30/2022    8:26 AM 03/26/2022    9:22 AM 09/20/2021    9:45 AM  GAD 7 : Generalized Anxiety Score  Nervous, Anxious, on Edge 0 0 0 0  Control/stop worrying 0 0 0 0  Worry too much - different things 0 0 0 0  Trouble relaxing 0 0 0 0  Restless 0 0 0 0  Easily annoyed or irritable 0 0 0 0  Afraid - awful  might happen 0 0 0 0  Total GAD 7 Score 0 0 0 0  Anxiety Difficulty Not difficult at all Not difficult at all Not difficult at all Not difficult at all       12/02/2022   10:16 AM 07/30/2022    8:26 AM 03/26/2022    9:22 AM  Depression screen PHQ 2/9  Decreased Interest 0 0 0  Down, Depressed, Hopeless 0 0 0  PHQ - 2 Score 0 0 0  Altered sleeping 0 0 0  Tired, decreased energy 0 0 0  Change in appetite 0 0 0  Feeling bad or failure about yourself  0 0 0  Trouble concentrating 0 0 0  Moving slowly or fidgety/restless 0 0 0  Suicidal  thoughts 0 0 0  PHQ-9 Score 0 0 0  Difficult doing work/chores Not difficult at all Not difficult at all Not difficult at all    BP Readings from Last 3 Encounters:  12/02/22 110/60  07/30/22 128/74  03/26/22 108/72    Physical Exam Vitals and nursing note reviewed.  Constitutional:      General: She is not in acute distress.    Appearance: Normal appearance. She is well-developed.  HENT:     Head: Normocephalic and atraumatic.  Cardiovascular:     Rate and Rhythm: Normal rate and regular rhythm.  Pulmonary:     Effort: Pulmonary effort is normal. No respiratory distress.     Breath sounds: No wheezing or rhonchi.  Abdominal:     General: Abdomen is flat.     Palpations: Abdomen is soft.     Tenderness: There is no abdominal tenderness.  Musculoskeletal:     Cervical back: Normal range of motion.     Right lower leg: No edema.     Left lower leg: No edema.  Skin:    General: Skin is warm and dry.     Findings: No rash.  Neurological:     Mental Status: She is alert and oriented to person, place, and time.  Psychiatric:        Mood and Affect: Mood normal.        Behavior: Behavior normal.     Wt Readings from Last 3 Encounters:  12/02/22 169 lb 6.4 oz (76.8 kg)  07/30/22 169 lb (76.7 kg)  03/26/22 171 lb (77.6 kg)    BP 110/60   Pulse 87   Ht 5\' 2"  (1.575 m)   Wt 169 lb 6.4 oz (76.8 kg)   SpO2 98%   BMI 30.98 kg/m    Assessment and Plan:  Problem List Items Addressed This Visit     Type II diabetes mellitus with complication (HCC) - Primary (Chronic)    Blood sugars stable without hypoglycemic symptoms or events. Currently being treated with metformin. Lab Results  Component Value Date   HGBA1C 6.8 (H) 07/31/2022        Relevant Medications   atorvastatin (LIPITOR) 40 MG tablet   Other Relevant Orders   Basic metabolic panel   Hemoglobin A1c   Microalbumin / creatinine urine ratio   Hyperlipidemia associated with type 2 diabetes mellitus (HCC) (Chronic)   Relevant Medications   atorvastatin (LIPITOR) 40 MG tablet   Hiatal hernia with GERD without esophagitis (Chronic)    Recent symptoms could be due to [facility] Avoid eating within 4 hours of bed Eliminate gluten as a possible trigger Elevate HOB if needed Continue PPI daily       Essential hypertension (Chronic)    Stable exam with well controlled BP.  Currently taking lisinopril. Tolerating medications without concerns or side effects. Will continue to recommend low sodium diet and current regimen.       Relevant Medications   atorvastatin (LIPITOR) 40 MG tablet   Other Relevant Orders   Basic metabolic panel   Other Visit Diagnoses     Long term current use of oral hypoglycemic drug       Encounter for screening mammogram for breast cancer       Relevant Orders   MM 3D SCREENING MAMMOGRAM BILATERAL BREAST       No follow-ups on file.   Partially dictated using Dragon software, any errors are not intentional.  Reubin Milan, MD [facility] Primary  Care and [place], [place]

## 2022-12-02 NOTE — Assessment & Plan Note (Signed)
Stable exam with well controlled BP.  Currently taking lisinopril. Tolerating medications without concerns or side effects. Will continue to recommend low sodium diet and current regimen.  

## 2022-12-03 DIAGNOSIS — I10 Essential (primary) hypertension: Secondary | ICD-10-CM | POA: Diagnosis not present

## 2022-12-03 DIAGNOSIS — E118 Type 2 diabetes mellitus with unspecified complications: Secondary | ICD-10-CM | POA: Diagnosis not present

## 2022-12-03 LAB — MICROALBUMIN / CREATININE URINE RATIO
Creatinine, Urine: 52 mg/dL
Microalb/Creat Ratio: <6 mg/g creat (ref 0–29)
Microalbumin, Urine: <3 ug/mL

## 2022-12-04 LAB — BASIC METABOLIC PANEL
BUN/Creatinine Ratio: 24 (ref 12–28)
BUN: 24 mg/dL (ref 8–27)
CO2: 21 mmol/L (ref 20–29)
Calcium: 9.6 mg/dL (ref 8.7–10.3)
Chloride: 105 mmol/L (ref 96–106)
Creatinine, Ser: 1 mg/dL (ref 0.57–1.00)
Glucose: 130 mg/dL — ABNORMAL HIGH (ref 70–99)
Potassium: 5 mmol/L (ref 3.5–5.2)
Sodium: 141 mmol/L (ref 134–144)
eGFR: 60 mL/min/{1.73_m2} (ref >59)

## 2022-12-04 LAB — HEMOGLOBIN A1C
Est. average glucose Bld gHb Est-mCnc: 151 mg/dL
Hgb A1c MFr Bld: 6.9 % — ABNORMAL HIGH (ref 4.8–5.6)

## 2023-03-19 ENCOUNTER — Ambulatory Visit
Admission: RE | Admit: 2023-03-19 | Discharge: 2023-03-19 | Disposition: A | Discharge status: Home / Self Care | Payer: Medicare Other | Source: Ambulatory Visit | Attending: Internal Medicine | Admitting: Internal Medicine

## 2023-03-19 DIAGNOSIS — Z1231 Encounter for screening mammogram for malignant neoplasm of breast: Secondary | ICD-10-CM | POA: Diagnosis not present

## 2023-03-26 ENCOUNTER — Ambulatory Visit: Payer: Medicare Other

## 2023-03-26 DIAGNOSIS — Z0000 Encounter for general adult medical examination without abnormal findings: Secondary | ICD-10-CM | POA: Diagnosis not present

## 2023-03-26 NOTE — Patient Instructions (Addendum)
Pamela Rosario , Thank you for taking time to come for your Medicare Wellness Visit. I appreciate your ongoing commitment to your health goals. Please review the following plan we discussed and let me know if I can assist you in the future.   Referrals/Orders/Follow-Ups/Clinician Recommendations: none  This is a list of the screening recommended for you and due dates:  Health Maintenance  Topic Date Due   COVID-19 Vaccine (1) Never done   DTaP/Tdap/Td vaccine (2 - Td or Tdap) 06/03/2022   Flu Shot  09/01/2023*   Complete foot exam   03/27/2023   Hemoglobin A1C  06/05/2023   Eye exam for diabetics  09/16/2023   Yearly kidney health urinalysis for diabetes  12/02/2023   Yearly kidney function blood test for diabetes  12/03/2023   Colon Cancer Screening  02/05/2024   Mammogram  03/18/2024   Medicare Annual Wellness Visit  03/25/2024   DEXA scan (bone density measurement)  Completed   Hepatitis C Screening  Completed   Zoster (Shingles) Vaccine  Completed   HPV Vaccine  Aged Out   Pneumonia Vaccine  Discontinued  *Topic was postponed. The date shown is not the original due date.    Advanced directives: (ACP Link)Information on Advanced Care Planning can be found at [facility] of [facility] Directives Advance Health Care Directives ([email or web address]   Next Medicare Annual Wellness Visit scheduled for next year: Yes   03/31/24 @ 1:40 pm by video
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
## 2023-03-31 DIAGNOSIS — E119 Type 2 diabetes mellitus without complications: Secondary | ICD-10-CM | POA: Diagnosis not present

## 2023-03-31 DIAGNOSIS — H35379 Puckering of macula, unspecified eye: Secondary | ICD-10-CM | POA: Diagnosis not present

## 2023-03-31 DIAGNOSIS — M3501 Sicca syndrome with keratoconjunctivitis: Secondary | ICD-10-CM | POA: Diagnosis not present

## 2023-03-31 DIAGNOSIS — H35371 Puckering of macula, right eye: Secondary | ICD-10-CM | POA: Diagnosis not present

## 2023-03-31 DIAGNOSIS — H538 Other visual disturbances: Secondary | ICD-10-CM | POA: Diagnosis not present

## 2023-03-31 DIAGNOSIS — H43813 Vitreous degeneration, bilateral: Secondary | ICD-10-CM | POA: Diagnosis not present

## 2023-04-02 NOTE — Assessment & Plan Note (Signed)
LDL is  Lab Results  Component Value Date   LDLCALC 94 07/31/2022    Current regimen is atorvastatin 40 mg.  Tolerating medications well without issues.

## 2023-04-02 NOTE — Progress Notes (Unsigned)
Date:  04/03/2023   Name:  Pamela Rosario   DOB:  1950/08/22   MRN:  [id number]   Chief Complaint: No chief complaint on file. Pamela Rosario is a 72 y.o. female who presents today for her Complete Annual Exam. She feels {DESC; WELL/FAIRLY WELL/POORLY:18703}. She reports exercising ***. She reports she is sleeping {DESC; WELL/FAIRLY WELL/POORLY:18703}. Breast complaints ***.  Mammogram: 03/2023 DEXA: 05/2019 Normal Colonoscopy: 02/2019 repeat 5 yrs.  Health Maintenance Due  Topic Date Due   COVID-19 Vaccine (1) Never done   DTaP/Tdap/Td (2 - Td or Tdap) 06/03/2022   FOOT EXAM  03/27/2023    Immunization History  Administered Date(s) Administered   Fluad Quad(high Dose 65+) 03/03/2019   Hepatitis B 06/03/1990   IPV 06/04/1955, 06/03/1966   Influenza, High Dose Seasonal PF 02/26/2018   Influenza,inj,Quad PF,6+ Mos 02/25/2017   Influenza-Unspecified 01/02/2016   MMR 06/03/1981   Pneumococcal Conjugate-13 09/28/2018   Pneumococcal Polysaccharide-23 11/15/2009   Tdap 06/03/2012   Varicella 06/03/1969   Zoster Recombinant(Shingrix) 08/14/2017, 12/22/2017     Hypertension This is a chronic problem. The problem is controlled. Pertinent negatives include no chest pain, headaches, palpitations or shortness of breath. Past treatments include ACE inhibitors. The current treatment provides significant improvement. There is no history of kidney disease, CAD/MI or CVA.  Diabetes She presents for her follow-up diabetic visit. She has type 2 diabetes mellitus. Pertinent negatives for hypoglycemia include no dizziness, headaches, nervousness/anxiousness or tremors. Pertinent negatives for diabetes include no chest pain, no fatigue, no polydipsia and no polyuria. Pertinent negatives for diabetic complications include no CVA. Current diabetic treatment includes oral agent (monotherapy). She is compliant with treatment all of the time.  Hyperlipidemia Pertinent negatives include no chest pain or  shortness of breath.  Gastroesophageal Reflux She reports no abdominal pain, no chest pain, no coughing or no wheezing. This is a recurrent problem. The problem occurs rarely. Pertinent negatives include no fatigue. She has tried a PPI for the symptoms. The treatment provided significant relief.    Review of Systems  Constitutional:  Negative for chills, fatigue and fever.  HENT:  Negative for congestion, hearing loss, tinnitus, trouble swallowing and voice change.   Eyes:  Negative for visual disturbance.  Respiratory:  Negative for cough, chest tightness, shortness of breath and wheezing.   Cardiovascular:  Negative for chest pain, palpitations and leg swelling.  Gastrointestinal:  Negative for abdominal pain, constipation, diarrhea and vomiting.  Endocrine: Negative for polydipsia and polyuria.  Genitourinary:  Negative for dysuria, frequency, genital sores, vaginal bleeding and vaginal discharge.  Musculoskeletal:  Negative for arthralgias, gait problem and joint swelling.  Skin:  Negative for color change and rash.  Neurological:  Negative for dizziness, tremors, light-headedness and headaches.  Hematological:  Negative for adenopathy. Does not bruise/bleed easily.  Psychiatric/Behavioral:  Negative for dysphoric mood and sleep disturbance. The patient is not nervous/anxious.      Lab Results  Component Value Date   NA 141 12/03/2022   K 5.0 12/03/2022   CO2 21 12/03/2022   GLUCOSE 130 (H) 12/03/2022   BUN 24 12/03/2022   CREATININE 1.00 12/03/2022   CALCIUM 9.6 12/03/2022   EGFR 60 12/03/2022   GFRNONAA 67 03/06/2020   Lab Results  Component Value Date   CHOL 155 07/31/2022   HDL 37 (L) 07/31/2022   LDLCALC 94 07/31/2022   TRIG 131 07/31/2022   CHOLHDL 4.2 07/31/2022   Lab Results  Component Value Date   TSH 2.410 03/27/2022  Lab Results  Component Value Date   HGBA1C 6.9 (H) 12/03/2022   Lab Results  Component Value Date   WBC 5.3 03/27/2022   HGB 13.6  03/27/2022   HCT 41.2 03/27/2022   MCV 85 03/27/2022   PLT 214 03/27/2022   Lab Results  Component Value Date   ALT 19 07/31/2022   AST 16 07/31/2022   ALKPHOS 93 07/31/2022   BILITOT 0.4 07/31/2022   No results found for: "25OHVITD2", "25OHVITD3", "VD25OH"   Patient Active Problem List   Diagnosis Date Noted   History of colonic polyps    Polyp of sigmoid colon    Stress incontinence of urine 02/25/2017   Hiatal hernia with GERD without esophagitis 09/06/2016   Fibrocystic breast changes of both breasts 09/06/2016   Intraductal papilloma of breast, left 09/06/2016   Hyperlipidemia associated with type 2 diabetes mellitus (HCC) 09/06/2016   Essential hypertension 09/06/2016   Type II diabetes mellitus with complication (HCC) 09/06/2016   Adenomatous colon polyp 09/06/2016    Allergies  Allergen Reactions   Demerol [Meperidine] Hives   Tetracyclines & Related Hives   Simvastatin Other (See Comments)    Muscle pain    Past Surgical History:  Procedure Laterality Date   brachial cleft cyst excison ( Right Neck )  1971   BREAST EXCISIONAL BIOPSY Left    intraductal papilloma   BREAST SURGERY  2007   L intraductal papilloma excision   CESAREAN SECTION  1990   emergency   COLONOSCOPY  08/24/2013   tubular adenoma   COLONOSCOPY WITH PROPOFOL N/A 02/05/2019   Procedure: COLONOSCOPY WITH BIOPSIES;  Surgeon: Midge Minium, MD;  Location: [facility];  Service: Endoscopy;  Laterality: N/A;  Diabetic - oral meds   ESOPHAGOGASTRODUODENOSCOPY  2005   hiatal hernia & polyp found   EXCISION / BIOPSY BREAST / NIPPLE / DUCT Left 2005   LARYNGOSCOPY  2006   left intraductal papilloma excision of left breast  2007   POLYPECTOMY N/A 02/05/2019   Procedure: POLYPECTOMY;  Surgeon: Midge Minium, MD;  Location: [facility];  Service: Endoscopy;  Laterality: N/A;   TONSILECTOMY, ADENOIDECTOMY, BILATERAL MYRINGOTOMY AND TUBES      Social History   Tobacco Use   Smoking  status: Never   Smokeless tobacco: Never   Tobacco comments:    smoking cessation materials not required  Vaping Use   Vaping status: Never Used  Substance Use Topics   Alcohol use: No   Drug use: No     Medication list has been reviewed and updated.  No outpatient medications have been marked as taking for the 04/03/23 encounter (Appointment) with Reubin Milan, MD.       12/02/2022   10:16 AM 07/30/2022    8:26 AM 03/26/2022    9:22 AM 09/20/2021    9:45 AM  GAD 7 : Generalized Anxiety Score  Nervous, Anxious, on Edge 0 0 0 0  Control/stop worrying 0 0 0 0  Worry too much - different things 0 0 0 0  Trouble relaxing 0 0 0 0  Restless 0 0 0 0  Easily annoyed or irritable 0 0 0 0  Afraid - awful might happen 0 0 0 0  Total GAD 7 Score 0 0 0 0  Anxiety Difficulty Not difficult at all Not difficult at all Not difficult at all Not difficult at all       03/26/2023   10:01 AM 12/02/2022   10:16 AM 07/30/2022  8:26 AM  Depression screen PHQ 2/9  Decreased Interest 0 0 0  Down, Depressed, Hopeless 0 0 0  PHQ - 2 Score 0 0 0  Altered sleeping 0 0 0  Tired, decreased energy 0 0 0  Change in appetite 0 0 0  Feeling bad or failure about yourself  0 0 0  Trouble concentrating 0 0 0  Moving slowly or fidgety/restless 0 0 0  Suicidal thoughts 0 0 0  PHQ-9 Score 0 0 0  Difficult doing work/chores Not difficult at all Not difficult at all Not difficult at all    BP Readings from Last 3 Encounters:  12/02/22 110/60  07/30/22 128/74  03/26/22 108/72    Physical Exam Vitals and nursing note reviewed.  Constitutional:      General: She is not in acute distress.    Appearance: She is well-developed.  HENT:     Head: Normocephalic and atraumatic.     Right Ear: Tympanic membrane and ear canal normal.     Left Ear: Tympanic membrane and ear canal normal.     Nose:     Right Sinus: No maxillary sinus tenderness.     Left Sinus: No maxillary sinus tenderness.  Eyes:      General: No scleral icterus.       Right eye: No discharge.        Left eye: No discharge.     Conjunctiva/sclera: Conjunctivae normal.  Neck:     Thyroid: No thyromegaly.     Vascular: No carotid bruit.  Cardiovascular:     Rate and Rhythm: Normal rate and regular rhythm.     Pulses: Normal pulses.     Heart sounds: Normal heart sounds.  Pulmonary:     Effort: Pulmonary effort is normal. No respiratory distress.     Breath sounds: No wheezing.  Abdominal:     General: Bowel sounds are normal.     Palpations: Abdomen is soft.     Tenderness: There is no abdominal tenderness.  Musculoskeletal:     Cervical back: Normal range of motion. No erythema.     Right lower leg: No edema.     Left lower leg: No edema.  Lymphadenopathy:     Cervical: No cervical adenopathy.  Skin:    General: Skin is warm and dry.     Findings: No rash.  Neurological:     Mental Status: She is alert and oriented to person, place, and time.     Cranial Nerves: No cranial nerve deficit.     Sensory: No sensory deficit.     Deep Tendon Reflexes: Reflexes are normal and symmetric.  Psychiatric:        Attention and Perception: Attention normal.        Mood and Affect: Mood normal.    Diabetic Foot Exam - Simple   No data filed      Wt Readings from Last 3 Encounters:  12/02/22 169 lb 6.4 oz (76.8 kg)  07/30/22 169 lb (76.7 kg)  03/26/22 171 lb (77.6 kg)    There were no vitals taken for this visit.  Assessment and Plan:  Problem List Items Addressed This Visit       Unprioritized   Essential hypertension - Primary (Chronic)    Controlled BP with normal exam. Current regimen is lisinopril. Will continue same medications; encourage continued reduced sodium diet.       Hiatal hernia with GERD without esophagitis (Chronic)    Reflux symptoms are  minimal on current therapy - omeprazole. No red flag signs such as weight loss, n/v, melena       Hyperlipidemia associated with type 2  diabetes mellitus (HCC) (Chronic)    LDL is  Lab Results  Component Value Date   LDLCALC 94 07/31/2022    Current regimen is atorvastatin 40 mg.  Tolerating medications well without issues.       Type II diabetes mellitus with complication (HCC) (Chronic)    Blood sugars stable without hypoglycemic symptoms or events. Currently managed with metformin. Changes made last visit are none. Lab Results  Component Value Date   HGBA1C 6.9 (H) 12/03/2022          No follow-ups on file.    Reubin Milan, MD [facility] [place]

## 2023-04-02 NOTE — Assessment & Plan Note (Signed)
Blood sugars stable without hypoglycemic symptoms or events. Currently managed with metformin. Changes made last visit are none. Lab Results  Component Value Date   HGBA1C 6.9 (H) 12/03/2022

## 2023-04-02 NOTE — Assessment & Plan Note (Signed)
Reflux symptoms are minimal on current therapy - omeprazole. No red flag signs such as weight loss, n/v, melena  

## 2023-04-02 NOTE — Assessment & Plan Note (Signed)
Controlled BP with normal exam. Current regimen is lisinopril. Will continue same medications; encourage continued reduced sodium diet.

## 2023-04-03 ENCOUNTER — Encounter: Payer: Self-pay | Admitting: Internal Medicine

## 2023-04-03 ENCOUNTER — Ambulatory Visit (INDEPENDENT_AMBULATORY_CARE_PROVIDER_SITE_OTHER): Payer: Medicare Other | Admitting: Internal Medicine

## 2023-04-03 VITALS — BP 116/76 | HR 82 | Ht 62.0 in | Wt 167.0 lb

## 2023-04-03 DIAGNOSIS — E118 Type 2 diabetes mellitus with unspecified complications: Secondary | ICD-10-CM

## 2023-04-03 DIAGNOSIS — E1169 Type 2 diabetes mellitus with other specified complication: Secondary | ICD-10-CM

## 2023-04-03 DIAGNOSIS — Z7984 Long term (current) use of oral hypoglycemic drugs: Secondary | ICD-10-CM

## 2023-04-03 DIAGNOSIS — I10 Essential (primary) hypertension: Secondary | ICD-10-CM | POA: Diagnosis not present

## 2023-04-03 DIAGNOSIS — N393 Stress incontinence (female) (male): Secondary | ICD-10-CM

## 2023-04-03 DIAGNOSIS — K449 Diaphragmatic hernia without obstruction or gangrene: Secondary | ICD-10-CM

## 2023-04-03 DIAGNOSIS — K219 Gastro-esophageal reflux disease without esophagitis: Secondary | ICD-10-CM

## 2023-04-03 DIAGNOSIS — E785 Hyperlipidemia, unspecified: Secondary | ICD-10-CM

## 2023-04-03 MED ORDER — METFORMIN HCL 500 MG PO TABS: ORAL_TABLET | ORAL | 3 refills | Status: DC

## 2023-04-03 MED ORDER — LISINOPRIL 10 MG PO TABS: 10.0000 mg | ORAL_TABLET | Freq: Every day | ORAL | 3 refills | Status: DC

## 2023-04-03 MED ORDER — TOLTERODINE TARTRATE 2 MG PO TABS: 2.0000 mg | ORAL_TABLET | Freq: Two times a day (BID) | ORAL | 3 refills | Status: DC

## 2023-04-03 MED ORDER — OMEPRAZOLE 20 MG PO CPDR: 20.0000 mg | DELAYED_RELEASE_CAPSULE | Freq: Two times a day (BID) | ORAL | 3 refills | Status: DC

## 2023-04-04 ENCOUNTER — Other Ambulatory Visit: Payer: Self-pay | Admitting: Internal Medicine

## 2023-04-04 DIAGNOSIS — E1169 Type 2 diabetes mellitus with other specified complication: Secondary | ICD-10-CM

## 2023-04-04 LAB — CBC WITH DIFFERENTIAL/PLATELET
Basophils Absolute: 0 10*3/uL (ref 0.0–0.2)
Basos: 0 %
EOS (ABSOLUTE): 0.2 10*3/uL (ref 0.0–0.4)
Eos: 3 %
Hematocrit: 42.6 % (ref 34.0–46.6)
Hemoglobin: 13.8 g/dL (ref 11.1–15.9)
Immature Grans (Abs): 0 10*3/uL (ref 0.0–0.1)
Immature Granulocytes: 0 %
Lymphocytes Absolute: 2.2 10*3/uL (ref 0.7–3.1)
Lymphs: 38 %
MCH: 27.9 pg (ref 26.6–33.0)
MCHC: 32.4 g/dL (ref 31.5–35.7)
MCV: 86 fL (ref 79–97)
Monocytes Absolute: 0.5 10*3/uL (ref 0.1–0.9)
Monocytes: 9 %
Neutrophils Absolute: 3 10*3/uL (ref 1.4–7.0)
Neutrophils: 50 %
Platelets: 226 10*3/uL (ref 150–450)
RBC: 4.95 x10E6/uL (ref 3.77–5.28)
RDW: 13.7 % (ref 11.7–15.4)
WBC: 6 10*3/uL (ref 3.4–10.8)

## 2023-04-04 LAB — COMPREHENSIVE METABOLIC PANEL
ALT: 21 [IU]/L (ref 0–32)
AST: 18 [IU]/L (ref 0–40)
Albumin: 4.4 g/dL (ref 3.8–4.8)
Alkaline Phosphatase: 83 [IU]/L (ref 44–121)
BUN/Creatinine Ratio: 21 (ref 12–28)
BUN: 19 mg/dL (ref 8–27)
Bilirubin Total: 0.3 mg/dL (ref 0.0–1.2)
CO2: 24 mmol/L (ref 20–29)
Calcium: 9.6 mg/dL (ref 8.7–10.3)
Chloride: 104 mmol/L (ref 96–106)
Creatinine, Ser: 0.92 mg/dL (ref 0.57–1.00)
Globulin, Total: 2.5 g/dL (ref 1.5–4.5)
Glucose: 120 mg/dL — ABNORMAL HIGH (ref 70–99)
Potassium: 4.8 mmol/L (ref 3.5–5.2)
Sodium: 142 mmol/L (ref 134–144)
Total Protein: 6.9 g/dL (ref 6.0–8.5)
eGFR: 67 mL/min/{1.73_m2} (ref >59)

## 2023-04-04 LAB — HEMOGLOBIN A1C
Est. average glucose Bld gHb Est-mCnc: 151 mg/dL
Hgb A1c MFr Bld: 6.9 % — ABNORMAL HIGH (ref 4.8–5.6)

## 2023-04-04 LAB — LIPID PANEL
Chol/HDL Ratio: 6.2 ratio — ABNORMAL HIGH (ref 0.0–4.4)
Cholesterol, Total: 242 mg/dL — ABNORMAL HIGH (ref 100–199)
HDL: 39 mg/dL — ABNORMAL LOW (ref >39)
LDL Chol Calc (NIH): 172 mg/dL — ABNORMAL HIGH (ref 0–99)
Triglycerides: 168 mg/dL — ABNORMAL HIGH (ref 0–149)
VLDL Cholesterol Cal: 31 mg/dL (ref 5–40)

## 2023-04-04 LAB — TSH: TSH: 1.94 u[IU]/mL (ref 0.450–4.500)

## 2023-04-04 MED ORDER — ROSUVASTATIN CALCIUM 5 MG PO TABS: 5.0000 mg | ORAL_TABLET | ORAL | 1 refills | Status: DC

## 2023-04-05 ENCOUNTER — Other Ambulatory Visit: Payer: Self-pay | Admitting: Internal Medicine

## 2023-04-05 DIAGNOSIS — E1169 Type 2 diabetes mellitus with other specified complication: Secondary | ICD-10-CM

## 2023-04-07 NOTE — Telephone Encounter (Signed)
Please review.  KP

## 2023-04-07 NOTE — Telephone Encounter (Signed)
Pharmacy comment: Patient has indicated to [place] that she is sensitive to Statins. There is possible cross -sensitivity . Okay to fill?  PLease respond with appropriate changes or comment to the Pharmacy.   Requested Prescriptions  Pending Prescriptions Disp Refills   CRESTOR 5 MG tablet [Pharmacy Med Name: CRESTOR TAB 5MG ]  1    Sig: TAKE 1 TABLET THREE TIMES  WEEKLY.     Cardiovascular:  Antilipid - Statins 2 Failed - 04/05/2023 10:44 AM      Failed - Lipid Panel in normal range within the last 12 months    Cholesterol, Total  Date Value Ref Range Status  04/03/2023 242 (H) 100 - 199 mg/dL Final   LDL Chol Calc (NIH)  Date Value Ref Range Status  04/03/2023 172 (H) 0 - 99 mg/dL Final   HDL  Date Value Ref Range Status  04/03/2023 39 (L) >39 mg/dL Final   Triglycerides  Date Value Ref Range Status  04/03/2023 168 (H) 0 - 149 mg/dL Final         Passed - Cr in normal range and within 360 days    Creatinine, Ser  Date Value Ref Range Status  04/03/2023 0.92 0.57 - 1.00 mg/dL Final         Passed - Patient is not pregnant      Passed - Valid encounter within last 12 months    Recent Outpatient Visits           4 days ago Essential hypertension   [place] [facility] at [facility], Nyoka Cowden, MD   4 months ago Type II diabetes mellitus with complication [facility])   [place] [facility] at [facility], Nyoka Cowden, MD   8 months ago Type II diabetes mellitus with complication [facility])   [place] [facility] at [facility], Nyoka Cowden, MD   1 year ago Essential hypertension   [place] [facility] at [facility], Nyoka Cowden, MD   1 year ago Essential hypertension   [place] [facility] at [facility], Nyoka Cowden, MD       Future Appointments             In 3 months Judithann Graves, Nyoka Cowden, MD [facility] at [facility], [facility]   In 1 year Judithann Graves, Nyoka Cowden, MD [facility] at [facility], [facility]

## 2023-06-02 DIAGNOSIS — H2511 Age-related nuclear cataract, right eye: Secondary | ICD-10-CM | POA: Diagnosis not present

## 2023-06-11 ENCOUNTER — Encounter: Payer: Self-pay | Admitting: Ophthalmology

## 2023-06-12 ENCOUNTER — Encounter: Payer: Self-pay | Admitting: Ophthalmology
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
## 2023-06-12 NOTE — Discharge Instructions (Signed)

## 2023-06-17 ENCOUNTER — Other Ambulatory Visit: Payer: Self-pay

## 2023-06-17 ENCOUNTER — Encounter: Payer: Self-pay | Admitting: Ophthalmology

## 2023-06-17 ENCOUNTER — Ambulatory Visit: Payer: Self-pay | Admitting: Anesthesiology

## 2023-06-17 ENCOUNTER — Encounter
Admission: RE | Disposition: A | Discharge status: Home / Self Care | Payer: Self-pay | Source: Home / Self Care | Attending: Ophthalmology

## 2023-06-17 ENCOUNTER — Ambulatory Visit
Admission: RE | Admit: 2023-06-17 | Discharge: 2023-06-17 | Disposition: A | Discharge status: Home / Self Care | Payer: Medicare Other | Attending: Ophthalmology | Admitting: Ophthalmology

## 2023-06-17 DIAGNOSIS — I10 Essential (primary) hypertension: Secondary | ICD-10-CM | POA: Diagnosis not present

## 2023-06-17 DIAGNOSIS — K219 Gastro-esophageal reflux disease without esophagitis: Secondary | ICD-10-CM | POA: Insufficient documentation

## 2023-06-17 DIAGNOSIS — Z7984 Long term (current) use of oral hypoglycemic drugs: Secondary | ICD-10-CM | POA: Diagnosis not present

## 2023-06-17 DIAGNOSIS — E1136 Type 2 diabetes mellitus with diabetic cataract: Secondary | ICD-10-CM | POA: Diagnosis not present

## 2023-06-17 DIAGNOSIS — H2511 Age-related nuclear cataract, right eye: Secondary | ICD-10-CM | POA: Diagnosis not present

## 2023-06-17 DIAGNOSIS — H2512 Age-related nuclear cataract, left eye: Secondary | ICD-10-CM | POA: Diagnosis not present

## 2023-06-17 DIAGNOSIS — K449 Diaphragmatic hernia without obstruction or gangrene: Secondary | ICD-10-CM | POA: Diagnosis not present

## 2023-06-17 HISTORY — DX: Allergic rhinitis due to pollen: J30.1

## 2023-06-17 HISTORY — DX: Essential (primary) hypertension: I10

## 2023-06-17 HISTORY — PX: CATARACT EXTRACTION W/PHACO: SHX586

## 2023-06-17 HISTORY — DX: Type 2 diabetes mellitus with unspecified complications: E11.8

## 2023-06-17 LAB — GLUCOSE, CAPILLARY: Glucose-Capillary: 139 mg/dL — ABNORMAL HIGH (ref 70–99)

## 2023-06-17 SURGERY — PHACOEMULSIFICATION, CATARACT, WITH IOL INSERTION
Anesthesia: Monitor Anesthesia Care | Site: Eye | Laterality: Right

## 2023-06-17 MED ORDER — MOXIFLOXACIN HCL 0.5 % OP SOLN
OPHTHALMIC | Status: DC | PRN
  Administered 2023-06-17: .2 mL via OPHTHALMIC

## 2023-06-17 MED ORDER — ARMC OPHTHALMIC DILATING DROPS
OPHTHALMIC | Status: AC
  Filled 2023-06-17: qty 0.5

## 2023-06-17 MED ORDER — SIGHTPATH DOSE#1 BSS IO SOLN
INTRAOCULAR | Status: DC | PRN
  Administered 2023-06-17: 15 mL via INTRAOCULAR

## 2023-06-17 MED ORDER — MIDAZOLAM HCL 2 MG/2ML IJ SOLN
INTRAMUSCULAR | Status: AC
  Filled 2023-06-17: qty 2

## 2023-06-17 MED ORDER — FENTANYL CITRATE (PF) 100 MCG/2ML IJ SOLN
INTRAMUSCULAR | Status: DC | PRN
  Administered 2023-06-17: 50 ug via INTRAVENOUS

## 2023-06-17 MED ORDER — ARMC OPHTHALMIC DILATING DROPS
1.0000 | OPHTHALMIC | Status: DC | PRN
  Administered 2023-06-17 (×3): 1 via OPHTHALMIC

## 2023-06-17 MED ORDER — FENTANYL CITRATE (PF) 100 MCG/2ML IJ SOLN
INTRAMUSCULAR | Status: AC
  Filled 2023-06-17: qty 2

## 2023-06-17 MED ORDER — SEVOFLURANE IN SOLN
RESPIRATORY_TRACT | Status: AC
Start: 2023-06-17 — End: ?
  Filled 2023-06-17: qty 250

## 2023-06-17 MED ORDER — TETRACAINE HCL 0.5 % OP SOLN
1.0000 [drp] | OPHTHALMIC | Status: DC | PRN
  Administered 2023-06-17 (×3): 1 [drp] via OPHTHALMIC

## 2023-06-17 MED ORDER — SIGHTPATH DOSE#1 NA CHONDROIT SULF-NA HYALURON 40-17 MG/ML IO SOLN
INTRAOCULAR | Status: DC | PRN
  Administered 2023-06-17: 1 mL via INTRAOCULAR

## 2023-06-17 MED ORDER — SIGHTPATH DOSE#1 BSS IO SOLN
INTRAOCULAR | Status: DC | PRN
  Administered 2023-06-17: 64 mL via OPHTHALMIC

## 2023-06-17 MED ORDER — MIDAZOLAM HCL 2 MG/2ML IJ SOLN
INTRAMUSCULAR | Status: DC | PRN
  Administered 2023-06-17: 1 mg via INTRAVENOUS

## 2023-06-17 MED ORDER — TETRACAINE HCL 0.5 % OP SOLN
OPHTHALMIC | Status: AC
  Filled 2023-06-17: qty 4

## 2023-06-17 MED ORDER — BRIMONIDINE TARTRATE-TIMOLOL 0.2-0.5 % OP SOLN
OPHTHALMIC | Status: DC | PRN
  Administered 2023-06-17: 1 [drp] via OPHTHALMIC

## 2023-06-17 MED ORDER — SIGHTPATH DOSE#1 BSS IO SOLN
INTRAOCULAR | Status: DC | PRN
  Administered 2023-06-17: 2 mL

## 2023-06-17 SURGICAL SUPPLY — 16 items
ANGLE REVERSE CUT SHRT 25GA (CUTTER) ×1
CANNULA ANT/CHMB 27G (MISCELLANEOUS) IMPLANT
CANNULA ANT/CHMB 27GA (MISCELLANEOUS)
CATARACT SUITE SIGHTPATH (MISCELLANEOUS) ×1
CYSTOTOME ANGL RVRS SHRT 25G (CUTTER) ×1 IMPLANT
FEE CATARACT SUITE SIGHTPATH (MISCELLANEOUS) ×1 IMPLANT
GLOVE BIOGEL PI IND STRL 8 (GLOVE) ×1 IMPLANT
GLOVE SURG LX STRL 8.0 MICRO (GLOVE) ×1 IMPLANT
LENS IOL TECNIS EYHANCE 16.0 (Intraocular Lens) IMPLANT
NDL FILTER BLUNT 18X1 1/2 (NEEDLE) ×1 IMPLANT
NEEDLE FILTER BLUNT 18X1 1/2 (NEEDLE) ×1
PACK VIT ANT 23G (MISCELLANEOUS) IMPLANT
RING MALYGIN (MISCELLANEOUS) IMPLANT
SUT ETHILON 10-0 CS-B-6CS-B-6 (SUTURE)
SUTURE EHLN 10-0 CS-B-6CS-B-6 (SUTURE) IMPLANT
SYR 3ML LL SCALE MARK (SYRINGE) ×1 IMPLANT

## 2023-06-17 NOTE — Anesthesia Postprocedure Evaluation (Signed)
 Anesthesia Post Note  Patient: Pamela Rosario  Procedure(s) Performed: CATARACT EXTRACTION PHACO AND INTRAOCULAR LENS PLACEMENT (IOC) RIGHT DIABETIC 8.35 00:55.2 (Right: Eye)  Patient location during evaluation: PACU Anesthesia Type: MAC Level of consciousness: awake and alert Pain management: pain level controlled Vital Signs Assessment: post-procedure vital signs reviewed and stable Respiratory status: spontaneous breathing, nonlabored ventilation, respiratory function stable and patient connected to nasal cannula oxygen Cardiovascular status: blood pressure returned to baseline and stable Postop Assessment: no apparent nausea or vomiting Anesthetic complications: no  No notable events documented.   Last Vitals:  Vitals:   06/17/23 0749 06/17/23 0754  BP: 101/60 108/67  Pulse: 81 78  Resp: 16 (!) 21  Temp: 36.6 C 36.5 C  SpO2: 94% 94%    Last Pain:  Vitals:   06/17/23 0754  TempSrc:   PainSc: 0-No pain                 Debby Mines
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
## 2023-06-17 NOTE — Op Note (Signed)
 PREOPERATIVE DIAGNOSIS:  Nuclear sclerotic cataract of the right eye.   POSTOPERATIVE DIAGNOSIS:  Cataract   OPERATIVE PROCEDURE:ORPROCALL@   SURGEON:  Elsie Carmine, MD.   ANESTHESIA:  Anesthesiologist: Leavy Ned, MD CRNA: Jahoo, Sonia, CRNA  1.      Managed anesthesia care. 2.      0.46ml of Shugarcaine was instilled in the eye following the paracentesis.   COMPLICATIONS:  None.   TECHNIQUE:   Stop and chop   DESCRIPTION OF PROCEDURE:  The patient was examined and consented in the preoperative holding area where the aforementioned topical anesthesia was applied to the right eye and then brought back to the Operating Room where the right eye was prepped and draped in the usual sterile ophthalmic fashion and a lid speculum was placed. A paracentesis was created with the side port blade and the anterior chamber was filled with viscoelastic. A near clear corneal incision was performed with the steel keratome. A continuous curvilinear capsulorrhexis was performed with a cystotome followed by the capsulorrhexis forceps. Hydrodissection and hydrodelineation were carried out with BSS on a blunt cannula. The lens was removed in a stop and chop  technique and the remaining cortical material was removed with the irrigation-aspiration handpiece. The capsular bag was inflated with viscoelastic and the Technis ZCB00  lens was placed in the capsular bag without complication. The remaining viscoelastic was removed from the eye with the irrigation-aspiration handpiece. The wounds were hydrated. The anterior chamber was flushed with BSS and the eye was inflated to physiologic pressure. 0.76ml of Vigamox  was placed in the anterior chamber. The wounds were found to be water  tight. The eye was dressed with Combigan . The patient was given protective glasses to wear throughout the day and a shield with which to sleep tonight. The patient was also given drops with which to begin a drop regimen today and will  follow-up with me in one day. Implant Name Type Inv. Item Serial No. Manufacturer Lot No. LRB No. Used Action  LENS IOL TECNIS EYHANCE 16.0 - [id number] Intraocular Lens LENS IOL TECNIS EYHANCE 16.0 [id number] [employer]  Right 1 Implanted   Procedure(s): CATARACT EXTRACTION PHACO AND INTRAOCULAR LENS PLACEMENT (IOC) RIGHT DIABETIC 8.35 00:55.2 (Right)  Electronically signed: Elsie Carmine 06/17/2023 7:48 AM

## 2023-06-17 NOTE — Transfer of Care (Signed)
 Immediate Anesthesia Transfer of Care Note  Patient: Pamela Rosario  Procedure(s) Performed: CATARACT EXTRACTION PHACO AND INTRAOCULAR LENS PLACEMENT (IOC) RIGHT DIABETIC 8.35 00:55.2 (Right: Eye)  Patient Location: PACU  Anesthesia Type: MAC  Level of Consciousness: awake, alert  and patient cooperative  Airway and Oxygen Therapy: Patient Spontanous Breathing and Patient connected to supplemental oxygen  Post-op Assessment: Post-op Vital signs reviewed, Patient's Cardiovascular Status Stable, Respiratory Function Stable, Patent Airway and No signs of Nausea or vomiting  Post-op Vital Signs: Reviewed and stable  Complications: No notable events documented.

## 2023-06-18 ENCOUNTER — Encounter: Payer: Self-pay | Admitting: Ophthalmology

## 2023-06-18 HISTORY — PX: EYE SURGERY: SHX253

## 2023-06-27 NOTE — Discharge Instructions (Signed)

## 2023-07-01 ENCOUNTER — Ambulatory Visit: Payer: Medicare Other | Admitting: Anesthesiology

## 2023-07-01 ENCOUNTER — Ambulatory Visit
Admission: RE | Admit: 2023-07-01 | Discharge: 2023-07-01 | Disposition: A | Discharge status: Home / Self Care | Payer: Medicare Other | Attending: Ophthalmology | Admitting: Ophthalmology

## 2023-07-01 ENCOUNTER — Other Ambulatory Visit: Payer: Self-pay

## 2023-07-01 ENCOUNTER — Encounter
Admission: RE | Disposition: A | Discharge status: Home / Self Care | Payer: Self-pay | Source: Home / Self Care | Attending: Ophthalmology

## 2023-07-01 DIAGNOSIS — K219 Gastro-esophageal reflux disease without esophagitis: Secondary | ICD-10-CM | POA: Insufficient documentation

## 2023-07-01 DIAGNOSIS — E1136 Type 2 diabetes mellitus with diabetic cataract: Secondary | ICD-10-CM | POA: Insufficient documentation

## 2023-07-01 DIAGNOSIS — H2512 Age-related nuclear cataract, left eye: Secondary | ICD-10-CM | POA: Insufficient documentation

## 2023-07-01 DIAGNOSIS — Z7984 Long term (current) use of oral hypoglycemic drugs: Secondary | ICD-10-CM | POA: Insufficient documentation

## 2023-07-01 DIAGNOSIS — I10 Essential (primary) hypertension: Secondary | ICD-10-CM | POA: Diagnosis not present

## 2023-07-01 HISTORY — PX: CATARACT EXTRACTION W/PHACO: SHX586

## 2023-07-01 LAB — GLUCOSE, CAPILLARY: Glucose-Capillary: 129 mg/dL — ABNORMAL HIGH (ref 70–99)

## 2023-07-01 SURGERY — PHACOEMULSIFICATION, CATARACT, WITH IOL INSERTION
Anesthesia: Monitor Anesthesia Care | Site: Eye | Laterality: Left

## 2023-07-01 MED ORDER — ARMC OPHTHALMIC DILATING DROPS
1.0000 | OPHTHALMIC | Status: DC | PRN
Start: 2023-07-01 — End: 2023-07-01
  Administered 2023-07-01 (×3): 1 via OPHTHALMIC

## 2023-07-01 MED ORDER — MIDAZOLAM HCL 2 MG/2ML IJ SOLN
INTRAMUSCULAR | Status: AC
Start: 2023-07-01 — End: ?
  Filled 2023-07-01: qty 2

## 2023-07-01 MED ORDER — FENTANYL CITRATE (PF) 100 MCG/2ML IJ SOLN
INTRAMUSCULAR | Status: AC
  Filled 2023-07-01: qty 2

## 2023-07-01 MED ORDER — SIGHTPATH DOSE#1 BSS IO SOLN
INTRAOCULAR | Status: DC | PRN
  Administered 2023-07-01: 45 mL via OPHTHALMIC

## 2023-07-01 MED ORDER — SIGHTPATH DOSE#1 BSS IO SOLN
INTRAOCULAR | Status: DC | PRN
  Administered 2023-07-01: 2 mL

## 2023-07-01 MED ORDER — SIGHTPATH DOSE#1 NA CHONDROIT SULF-NA HYALURON 40-17 MG/ML IO SOLN
INTRAOCULAR | Status: DC | PRN
  Administered 2023-07-01: 1 mL via INTRAOCULAR

## 2023-07-01 MED ORDER — BRIMONIDINE TARTRATE-TIMOLOL 0.2-0.5 % OP SOLN
OPHTHALMIC | Status: DC | PRN
  Administered 2023-07-01: 1 [drp] via OPHTHALMIC

## 2023-07-01 MED ORDER — ARMC OPHTHALMIC DILATING DROPS
OPHTHALMIC | Status: AC
  Filled 2023-07-01: qty 0.5

## 2023-07-01 MED ORDER — TETRACAINE HCL 0.5 % OP SOLN
1.0000 [drp] | OPHTHALMIC | Status: DC | PRN
Start: 2023-07-01 — End: 2023-07-01
  Administered 2023-07-01 (×3): 1 [drp] via OPHTHALMIC

## 2023-07-01 MED ORDER — TETRACAINE HCL 0.5 % OP SOLN
OPHTHALMIC | Status: AC
  Filled 2023-07-01: qty 4

## 2023-07-01 MED ORDER — SIGHTPATH DOSE#1 BSS IO SOLN
INTRAOCULAR | Status: DC | PRN
  Administered 2023-07-01: 15 mL via INTRAOCULAR

## 2023-07-01 MED ORDER — FENTANYL CITRATE (PF) 100 MCG/2ML IJ SOLN
INTRAMUSCULAR | Status: DC | PRN
  Administered 2023-07-01: 50 ug via INTRAVENOUS

## 2023-07-01 MED ORDER — SODIUM CHLORIDE 0.9% FLUSH: 3.0000 mL | INTRAVENOUS | Status: DC | PRN

## 2023-07-01 MED ORDER — MIDAZOLAM HCL 2 MG/2ML IJ SOLN
INTRAMUSCULAR | Status: DC | PRN
  Administered 2023-07-01: 2 mg via INTRAVENOUS

## 2023-07-01 MED ORDER — MOXIFLOXACIN HCL 0.5 % OP SOLN
OPHTHALMIC | Status: DC | PRN
  Administered 2023-07-01: .2 mL via OPHTHALMIC

## 2023-07-01 MED ORDER — SODIUM CHLORIDE 0.9% FLUSH: 3.0000 mL | Freq: Two times a day (BID) | INTRAVENOUS | Status: DC

## 2023-07-01 SURGICAL SUPPLY — 12 items
CANNULA ANT/CHMB 27G (MISCELLANEOUS) IMPLANT
CANNULA ANT/CHMB 27GA (MISCELLANEOUS)
CATARACT SUITE SIGHTPATH (MISCELLANEOUS) ×1
CYSTOTOME ANG REV CUT SHRT 25G (CUTTER) ×1
CYSTOTOME ANGL RVRS SHRT 25G (CUTTER) ×1 IMPLANT
FEE CATARACT SUITE SIGHTPATH (MISCELLANEOUS) ×1 IMPLANT
GLOVE BIOGEL PI IND STRL 8 (GLOVE) ×1 IMPLANT
GLOVE SURG LX STRL 8.0 MICRO (GLOVE) ×1 IMPLANT
LENS IOL TECNIS EYHANCE 15.5 (Intraocular Lens) IMPLANT
NDL FILTER BLUNT 18X1 1/2 (NEEDLE) ×1 IMPLANT
NEEDLE FILTER BLUNT 18X1 1/2 (NEEDLE) ×1
SYR 3ML LL SCALE MARK (SYRINGE) ×1 IMPLANT

## 2023-07-01 NOTE — Transfer of Care (Signed)
Immediate Anesthesia Transfer of Care Note  Patient: Pamela Rosario  Procedure(s) Performed: CATARACT EXTRACTION PHACO AND INTRAOCULAR LENS PLACEMENT (IOC) LEFT DIABETIC 5.26 00:37.3 (Left: Eye)  Patient Location: PACU  Anesthesia Type: MAC  Level of Consciousness: awake, alert  and patient cooperative  Airway and Oxygen Therapy: Patient Spontanous Breathing and Patient connected to supplemental oxygen  Post-op Assessment: Post-op Vital signs reviewed, Patient's Cardiovascular Status Stable, Respiratory Function Stable, Patent Airway and No signs of Nausea or vomiting  Post-op Vital Signs: Reviewed and stable  Complications: No notable events documented.

## 2023-07-01 NOTE — Op Note (Signed)
PREOPERATIVE DIAGNOSIS:  Nuclear sclerotic cataract of the left eye.   POSTOPERATIVE DIAGNOSIS:  Nuclear sclerotic cataract of the left eye.   OPERATIVE PROCEDURE:ORPROCALL@   SURGEON:  Galen Manila, MD.   ANESTHESIA:  Anesthesiologist: Marisue Humble, MD CRNA: Andee Poles, CRNA  1.      Managed anesthesia care. 2.     0.15ml of Shugarcaine was instilled following the paracentesis   COMPLICATIONS:  None.   TECHNIQUE:   Stop and chop   DESCRIPTION OF PROCEDURE:  The patient was examined and consented in the preoperative holding area where the aforementioned topical anesthesia was applied to the left eye and then brought back to the Operating Room where the left eye was prepped and draped in the usual sterile ophthalmic fashion and a lid speculum was placed. A paracentesis was created with the side port blade and the anterior chamber was filled with viscoelastic. A near clear corneal incision was performed with the steel keratome. A continuous curvilinear capsulorrhexis was performed with a cystotome followed by the capsulorrhexis forceps. Hydrodissection and hydrodelineation were carried out with BSS on a blunt cannula. The lens was removed in a stop and chop  technique and the remaining cortical material was removed with the irrigation-aspiration handpiece. The capsular bag was inflated with viscoelastic and the Technis ZCB00 lens was placed in the capsular bag without complication. The remaining viscoelastic was removed from the eye with the irrigation-aspiration handpiece. The wounds were hydrated. The anterior chamber was flushed with BSS and the eye was inflated to physiologic pressure. 0.74ml Vigamox was placed in the anterior chamber. The wounds were found to be water tight. The eye was dressed with Combigan. The patient was given protective glasses to wear throughout the day and a shield with which to sleep tonight. The patient was also given drops with which to begin a drop regimen  today and will follow-up with me in one day. Implant Name Type Inv. Item Serial No. Manufacturer Lot No. LRB No. Used Action  LENS IOL TECNIS EYHANCE 15.5 - [id number] Intraocular Lens LENS IOL TECNIS EYHANCE 15.5 [id number] [employer]  Left 1 Implanted    Procedure(s): CATARACT EXTRACTION PHACO AND INTRAOCULAR LENS PLACEMENT (IOC) LEFT DIABETIC 5.26 00:37.3 (Left)  Electronically signed: Galen Manila 07/01/2023 8:07 AM
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
## 2023-07-02 ENCOUNTER — Encounter: Payer: Self-pay | Admitting: Ophthalmology

## 2023-07-02 NOTE — Anesthesia Postprocedure Evaluation (Signed)
Anesthesia Post Note  Patient: Doniqua Bunten  Procedure(s) Performed: CATARACT EXTRACTION PHACO AND INTRAOCULAR LENS PLACEMENT (IOC) LEFT DIABETIC 5.26 00:37.3 (Left: Eye)  Patient location during evaluation: PACU Anesthesia Type: MAC Level of consciousness: awake and alert Pain management: pain level controlled Vital Signs Assessment: post-procedure vital signs reviewed and stable Respiratory status: spontaneous breathing, nonlabored ventilation, respiratory function stable and patient connected to nasal cannula oxygen Cardiovascular status: stable and blood pressure returned to baseline Postop Assessment: no apparent nausea or vomiting Anesthetic complications: no   No notable events documented.   Last Vitals:  Vitals:   07/01/23 0810 07/01/23 0813  BP:  113/62  Pulse: 86 80  Resp: (!) 21 18  Temp:    SpO2: 94% 94%    Last Pain:  Vitals:   07/02/23 0920  TempSrc:   PainSc: 0-No pain                 Becky Colan C Bahar Shelden

## 2023-07-08 DIAGNOSIS — L2389 Allergic contact dermatitis due to other agents: Secondary | ICD-10-CM | POA: Diagnosis not present

## 2023-07-08 DIAGNOSIS — Z86018 Personal history of other benign neoplasm: Secondary | ICD-10-CM | POA: Diagnosis not present

## 2023-07-08 DIAGNOSIS — L578 Other skin changes due to chronic exposure to nonionizing radiation: Secondary | ICD-10-CM | POA: Diagnosis not present

## 2023-07-21 DIAGNOSIS — Z961 Presence of intraocular lens: Secondary | ICD-10-CM | POA: Diagnosis not present

## 2023-07-24 ENCOUNTER — Ambulatory Visit: Payer: Medicare Other | Admitting: Internal Medicine

## 2023-07-28 ENCOUNTER — Ambulatory Visit (INDEPENDENT_AMBULATORY_CARE_PROVIDER_SITE_OTHER): Payer: Medicare Other | Admitting: Internal Medicine

## 2023-07-28 ENCOUNTER — Encounter: Payer: Self-pay | Admitting: Internal Medicine

## 2023-07-28 VITALS — BP 114/78 | HR 94 | Ht 62.99 in | Wt 169.0 lb

## 2023-07-28 DIAGNOSIS — E1169 Type 2 diabetes mellitus with other specified complication: Secondary | ICD-10-CM

## 2023-07-28 DIAGNOSIS — Z7984 Long term (current) use of oral hypoglycemic drugs: Secondary | ICD-10-CM | POA: Diagnosis not present

## 2023-07-28 DIAGNOSIS — I10 Essential (primary) hypertension: Secondary | ICD-10-CM | POA: Diagnosis not present

## 2023-07-28 DIAGNOSIS — E118 Type 2 diabetes mellitus with unspecified complications: Secondary | ICD-10-CM | POA: Diagnosis not present

## 2023-07-28 DIAGNOSIS — E785 Hyperlipidemia, unspecified: Secondary | ICD-10-CM | POA: Diagnosis not present

## 2023-07-28 NOTE — Assessment & Plan Note (Signed)
 Blood sugars stable without hypoglycemic symptoms or events. Currently managed with MTF. Lab Results  Component Value Date   HGBA1C 6.9 (H) 04/03/2023

## 2023-07-28 NOTE — Progress Notes (Signed)
 Date:  07/28/2023   Name:  Pamela Rosario   DOB:  05-03-1951   MRN:  [id number]   Chief Complaint: Medical Management of Chronic Issues (Patient presents today for a follow up on her HTN, DM, and get labs.Francis Dowse has been taken her medications as directed. She does not have any concerns for today's visit.)  Diabetes She presents for her follow-up diabetic visit. She has type 2 diabetes mellitus. Her disease course has been stable. Pertinent negatives for hypoglycemia include no dizziness, headaches or nervousness/anxiousness. Pertinent negatives for diabetes include no chest pain and no fatigue.  Hypertension Pertinent negatives include no chest pain, headaches, palpitations or shortness of breath.  Hyperlipidemia This is a chronic problem. Pertinent negatives include no chest pain or shortness of breath. Current antihyperlipidemic treatment includes statins (tolerating crestor tiw).    Review of Systems  Constitutional:  Negative for chills, fatigue and unexpected weight change.  Respiratory:  Negative for chest tightness and shortness of breath.   Cardiovascular:  Negative for chest pain, palpitations and leg swelling.  Neurological:  Negative for dizziness and headaches.  Psychiatric/Behavioral:  Negative for dysphoric mood and sleep disturbance. The patient is not nervous/anxious.      Lab Results  Component Value Date   NA 142 04/03/2023   K 4.8 04/03/2023   CO2 24 04/03/2023   GLUCOSE 120 (H) 04/03/2023   BUN 19 04/03/2023   CREATININE 0.92 04/03/2023   CALCIUM 9.6 04/03/2023   EGFR 67 04/03/2023   GFRNONAA 67 03/06/2020   Lab Results  Component Value Date   CHOL 242 (H) 04/03/2023   HDL 39 (L) 04/03/2023   LDLCALC 172 (H) 04/03/2023   TRIG 168 (H) 04/03/2023   CHOLHDL 6.2 (H) 04/03/2023   Lab Results  Component Value Date   TSH 1.940 04/03/2023   Lab Results  Component Value Date   HGBA1C 6.9 (H) 04/03/2023   Lab Results  Component Value Date   WBC 6.0  04/03/2023   HGB 13.8 04/03/2023   HCT 42.6 04/03/2023   MCV 86 04/03/2023   PLT 226 04/03/2023   Lab Results  Component Value Date   ALT 21 04/03/2023   AST 18 04/03/2023   ALKPHOS 83 04/03/2023   BILITOT 0.3 04/03/2023   No results found for: "25OHVITD2", "25OHVITD3", "VD25OH"   Patient Active Problem List   Diagnosis Date Noted   History of colonic polyps    Polyp of sigmoid colon    Stress incontinence of urine 02/25/2017   Hiatal hernia with GERD without esophagitis 09/06/2016   Fibrocystic breast changes of both breasts 09/06/2016   Intraductal papilloma of breast, left 09/06/2016   Hyperlipidemia associated with type 2 diabetes mellitus (HCC) 09/06/2016   Essential hypertension 09/06/2016   Type II diabetes mellitus with complication (HCC) 09/06/2016   Adenomatous colon polyp 09/06/2016    Allergies  Allergen Reactions   Demerol [Meperidine] Hives   Tetracyclines & Related Hives   Simvastatin Other (See Comments)    Muscle pain    Past Surgical History:  Procedure Laterality Date   brachial cleft cyst excison ( Right Neck )  1971   BREAST EXCISIONAL BIOPSY Left    intraductal papilloma   BREAST SURGERY  2007   L intraductal papilloma excision   CATARACT EXTRACTION W/PHACO Right 06/17/2023   Procedure: CATARACT EXTRACTION PHACO AND INTRAOCULAR LENS PLACEMENT (IOC) RIGHT DIABETIC 8.35 00:55.2;  Surgeon: Galen Manila, MD;  Location: [facility];  Service: Ophthalmology;  Laterality: Right;  CATARACT EXTRACTION W/PHACO Left 07/01/2023   Procedure: CATARACT EXTRACTION PHACO AND INTRAOCULAR LENS PLACEMENT (IOC) LEFT DIABETIC 5.26 00:37.3;  Surgeon: Galen Manila, MD;  Location: [facility];  Service: Ophthalmology;  Laterality: Left;   CESAREAN SECTION  1990   emergency   COLONOSCOPY  08/24/2013   tubular adenoma   COLONOSCOPY WITH PROPOFOL N/A 02/05/2019   Procedure: COLONOSCOPY WITH BIOPSIES;  Surgeon: Midge Minium, MD;  Location: [facility];  Service: Endoscopy;  Laterality: N/A;  Diabetic - oral meds   ESOPHAGOGASTRODUODENOSCOPY  2005   hiatal hernia & polyp found   EXCISION / BIOPSY BREAST / NIPPLE / DUCT Left 2005   LARYNGOSCOPY  2006   left intraductal papilloma excision of left breast  2007   POLYPECTOMY N/A 02/05/2019   Procedure: POLYPECTOMY;  Surgeon: Midge Minium, MD;  Location: [facility];  Service: Endoscopy;  Laterality: N/A;   TONSILECTOMY, ADENOIDECTOMY, BILATERAL MYRINGOTOMY AND TUBES      Social History   Tobacco Use   Smoking status: Never   Smokeless tobacco: Never   Tobacco comments:    smoking cessation materials not required  Vaping Use   Vaping status: Never Used  Substance Use Topics   Alcohol use: No   Drug use: No     Medication list has been reviewed and updated.  Current Meds  Medication Sig   glucose blood test strip 1 each by Other route as needed for other. Use as instructed   levocetirizine (XYZAL) 5 MG tablet Take 5 mg by mouth every evening. PRN   lisinopril (ZESTRIL) 10 MG tablet Take 1 tablet (10 mg total) by mouth daily.   metFORMIN (GLUCOPHAGE) 500 MG tablet TAKE 1/2 TABLET DAILY   Naproxen Sodium (ALEVE PO) Take by mouth as needed.   omeprazole (PRILOSEC) 20 MG capsule Take 1 capsule (20 mg total) by mouth 2 (two) times daily before a meal.   rosuvastatin (CRESTOR) 5 MG tablet TAKE 1 TABLET THREE TIMES  WEEKLY.   tolterodine (DETROL) 2 MG tablet Take 1 tablet (2 mg total) by mouth 2 (two) times daily.       07/28/2023    3:45 PM 04/03/2023    8:01 AM 12/02/2022   10:16 AM 07/30/2022    8:26 AM  GAD 7 : Generalized Anxiety Score  Nervous, Anxious, on Edge 0 0 0 0  Control/stop worrying 0 0 0 0  Worry too much - different things 0 0 0 0  Trouble relaxing 0 0 0 0  Restless 0 0 0 0  Easily annoyed or irritable 0 0 0 0  Afraid - awful might happen 0 0 0 0  Total GAD 7 Score 0 0 0 0  Anxiety Difficulty Not difficult at all Not difficult at all Not  difficult at all Not difficult at all       07/28/2023    3:44 PM 04/03/2023    8:01 AM 03/26/2023   10:01 AM  Depression screen PHQ 2/9  Decreased Interest 0 0 0  Down, Depressed, Hopeless 0 0 0  PHQ - 2 Score 0 0 0  Altered sleeping 0 0 0  Tired, decreased energy 0 0 0  Change in appetite 0 0 0  Feeling bad or failure about yourself  0 0 0  Trouble concentrating 0 0 0  Moving slowly or fidgety/restless 0 0 0  Suicidal thoughts 0 0 0  PHQ-9 Score 0 0 0  Difficult doing work/chores Not difficult at all Not  difficult at all Not difficult at all    BP Readings from Last 3 Encounters:  07/28/23 114/78  07/01/23 113/62  06/17/23 108/67    Physical Exam Vitals and nursing note reviewed.  Constitutional:      General: She is not in acute distress.    Appearance: Normal appearance. She is well-developed.  HENT:     Head: Normocephalic and atraumatic.  Neck:     Vascular: No carotid bruit.  Cardiovascular:     Rate and Rhythm: Normal rate and regular rhythm.  Pulmonary:     Effort: Pulmonary effort is normal. No respiratory distress.     Breath sounds: No wheezing or rhonchi.  Musculoskeletal:     Cervical back: Normal range of motion.     Right lower leg: No edema.     Left lower leg: No edema.  Lymphadenopathy:     Cervical: No cervical adenopathy.  Skin:    General: Skin is warm and dry.     Findings: No rash.  Neurological:     General: No focal deficit present.     Mental Status: She is alert and oriented to person, place, and time.  Psychiatric:        Mood and Affect: Mood normal.        Behavior: Behavior normal.     Wt Readings from Last 3 Encounters:  07/28/23 169 lb (76.7 kg)  07/01/23 169 lb (76.7 kg)  06/17/23 168 lb 14.4 oz (76.6 kg)    BP 114/78   Pulse 94   Ht 5' 2.99" (1.6 m)   Wt 169 lb (76.7 kg)   SpO2 97%   BMI 29.95 kg/m   Assessment and Plan:  Problem List Items Addressed This Visit       Unprioritized   Hyperlipidemia  associated with type 2 diabetes mellitus (HCC) (Chronic)   Tolerating three times per week Crestor without side effects. Will check labs       Relevant Orders   Comprehensive metabolic panel   Lipid panel   Essential hypertension - Primary (Chronic)   Controlled BP with normal exam. Current regimen is lisinopril. Will continue same medications; encourage continued reduced sodium diet.       Type II diabetes mellitus with complication (HCC) (Chronic)   Blood sugars stable without hypoglycemic symptoms or events. Currently managed with MTF. Lab Results  Component Value Date   HGBA1C 6.9 (H) 04/03/2023         Relevant Orders   Comprehensive metabolic panel   Hemoglobin A1c   Other Visit Diagnoses       Long term current use of oral hypoglycemic drug           Return in about 4 months (around 11/25/2023) for DM, HTN.    Reubin Milan, MD [facility] [place]

## 2023-07-28 NOTE — Assessment & Plan Note (Signed)
 Controlled BP with normal exam. Current regimen is lisinopril. Will continue same medications; encourage continued reduced sodium diet.

## 2023-07-28 NOTE — Assessment & Plan Note (Signed)
 Tolerating three times per week Crestor without side effects. Will check labs

## 2023-07-29 ENCOUNTER — Encounter: Payer: Self-pay | Admitting: Internal Medicine

## 2023-07-29 LAB — COMPREHENSIVE METABOLIC PANEL
ALT: 21 [IU]/L (ref 0–32)
AST: 19 [IU]/L (ref 0–40)
Albumin: 4.3 g/dL (ref 3.8–4.8)
Alkaline Phosphatase: 78 [IU]/L (ref 44–121)
BUN/Creatinine Ratio: 25 (ref 12–28)
BUN: 21 mg/dL (ref 8–27)
Bilirubin Total: 0.2 mg/dL (ref 0.0–1.2)
CO2: 22 mmol/L (ref 20–29)
Calcium: 9.7 mg/dL (ref 8.7–10.3)
Chloride: 104 mmol/L (ref 96–106)
Creatinine, Ser: 0.83 mg/dL (ref 0.57–1.00)
Globulin, Total: 2.4 g/dL (ref 1.5–4.5)
Glucose: 97 mg/dL (ref 70–99)
Potassium: 4.7 mmol/L (ref 3.5–5.2)
Sodium: 141 mmol/L (ref 134–144)
Total Protein: 6.7 g/dL (ref 6.0–8.5)
eGFR: 75 mL/min/{1.73_m2} (ref >59)

## 2023-07-29 LAB — LIPID PANEL
Chol/HDL Ratio: 5.1 {ratio} — ABNORMAL HIGH (ref 0.0–4.4)
Cholesterol, Total: 175 mg/dL (ref 100–199)
HDL: 34 mg/dL — ABNORMAL LOW (ref >39)
LDL Chol Calc (NIH): 91 mg/dL (ref 0–99)
Triglycerides: 301 mg/dL — ABNORMAL HIGH (ref 0–149)
VLDL Cholesterol Cal: 50 mg/dL — ABNORMAL HIGH (ref 5–40)

## 2023-07-29 LAB — HEMOGLOBIN A1C
Est. average glucose Bld gHb Est-mCnc: 151 mg/dL
Hgb A1c MFr Bld: 6.9 % — ABNORMAL HIGH (ref 4.8–5.6)

## 2023-09-09 ENCOUNTER — Other Ambulatory Visit: Payer: Self-pay | Admitting: Internal Medicine

## 2023-09-09 DIAGNOSIS — E1169 Type 2 diabetes mellitus with other specified complication: Secondary | ICD-10-CM

## 2023-10-15 ENCOUNTER — Other Ambulatory Visit: Payer: Self-pay

## 2023-10-15 ENCOUNTER — Telehealth: Payer: Self-pay

## 2023-10-15 DIAGNOSIS — Z860100 Personal history of colon polyps, unspecified: Secondary | ICD-10-CM

## 2023-10-15 MED ORDER — NA SULFATE-K SULFATE-MG SULF 17.5-3.13-1.6 GM/177ML PO SOLN: 1.0000 | Freq: Once | ORAL | 0 refills | Status: AC

## 2023-10-15 NOTE — Telephone Encounter (Signed)
 Gastroenterology Pre-Procedure Review  Request Date: 02/24/24 Requesting Physician: Dr. Ole Berkeley  PATIENT REVIEW QUESTIONS: The patient responded to the following health history questions as indicated:    1. Are you having any GI issues? fullness 2. Do you have a personal history of Polyps? yes (last colonoscopy performed by Dr. Ole Berkeley 02/05/2019) 3. Do you have a family history of Colon Cancer or Polyps? no 4. Diabetes Mellitus? yes (takes metformin  has been advised to stop 2 days prior to colonoscopy) 5. Joint replacements in the past 12 months?no 6. Major health problems in the past 3 months?no 7. Any artificial heart valves, MVP, or defibrillator?no    MEDICATIONS & ALLERGIES:    Patient reports the following regarding taking any anticoagulation/antiplatelet therapy:   Plavix, Coumadin, Eliquis, Xarelto, Lovenox, Pradaxa, Brilinta, or Effient? no Aspirin? no  Patient confirms/reports the following medications:  Current Outpatient Medications  Medication Sig Dispense Refill   Na Sulfate-K Sulfate-Mg Sulfate concentrate (SUPREP) 17.5-3.13-1.6 GM/177ML SOLN Take 1 kit (354 mLs total) by mouth once for 1 dose. 354 mL 0   glucose blood test strip 1 each by Other route as needed for other. Use as instructed     levocetirizine (XYZAL) 5 MG tablet Take 5 mg by mouth every evening. PRN     lisinopril  (ZESTRIL ) 10 MG tablet Take 1 tablet (10 mg total) by mouth daily. 90 tablet 3   metFORMIN  (GLUCOPHAGE ) 500 MG tablet TAKE 1/2 TABLET DAILY 45 tablet 3   Naproxen Sodium (ALEVE PO) Take by mouth as needed.     omeprazole  (PRILOSEC) 20 MG capsule Take 1 capsule (20 mg total) by mouth 2 (two) times daily before a meal. 180 capsule 3   rosuvastatin  (CRESTOR ) 5 MG tablet TAKE 1 TABLET THREE TIMES  WEEKLY 36 tablet 1   tolterodine  (DETROL ) 2 MG tablet Take 1 tablet (2 mg total) by mouth 2 (two) times daily. 180 tablet 3   No current facility-administered medications for this visit.    Patient  confirms/reports the following allergies:  Allergies  Allergen Reactions   Demerol [Meperidine] Hives   Tetracyclines & Related Hives   Simvastatin Other (See Comments)    Muscle pain    No orders of the defined types were placed in this encounter.   AUTHORIZATION INFORMATION Primary Insurance: 1D#: Group #:  Secondary Insurance: 1D#: Group #:  SCHEDULE INFORMATION: Date: 02/24/24 Time: Location: [facility]

## 2023-10-15 NOTE — Progress Notes (Signed)
 Gastroenterology Pre-Procedure Review  Request Date: 02/24/24 Requesting Physician: Dr. Ole Berkeley  PATIENT REVIEW QUESTIONS: The patient responded to the following health history questions as indicated:    1. Are you having any GI issues?fullness in the stomach 2. Do you have a personal history of Polyps? yes (last colonoscopy performed by Dr. Ole Berkeley 02/05/19) 3. Do you have a family history of Colon Cancer or Polyps? no 4. Diabetes Mellitus? yes (takes metformin  has been advised to stop 2 days prior to colonoscopy noted on instructions) 5. Joint replacements in the past 12 months?no 6. Major health problems in the past 3 months?no 7. Any artificial heart valves, MVP, or defibrillator?no    MEDICATIONS & ALLERGIES:    Patient reports the following regarding taking any anticoagulation/antiplatelet therapy:   Plavix, Coumadin, Eliquis, Xarelto, Lovenox, Pradaxa, Brilinta, or Effient? no Aspirin? no  Patient confirms/reports the following medications:  Current Outpatient Medications  Medication Sig Dispense Refill   glucose blood test strip 1 each by Other route as needed for other. Use as instructed     levocetirizine (XYZAL) 5 MG tablet Take 5 mg by mouth every evening. PRN     lisinopril  (ZESTRIL ) 10 MG tablet Take 1 tablet (10 mg total) by mouth daily. 90 tablet 3   metFORMIN  (GLUCOPHAGE ) 500 MG tablet TAKE 1/2 TABLET DAILY 45 tablet 3   Na Sulfate-K Sulfate-Mg Sulfate concentrate (SUPREP) 17.5-3.13-1.6 GM/177ML SOLN Take 1 kit (354 mLs total) by mouth once for 1 dose. 354 mL 0   Naproxen Sodium (ALEVE PO) Take by mouth as needed.     omeprazole  (PRILOSEC) 20 MG capsule Take 1 capsule (20 mg total) by mouth 2 (two) times daily before a meal. 180 capsule 3   rosuvastatin  (CRESTOR ) 5 MG tablet TAKE 1 TABLET THREE TIMES  WEEKLY 36 tablet 1   tolterodine  (DETROL ) 2 MG tablet Take 1 tablet (2 mg total) by mouth 2 (two) times daily. 180 tablet 3   No current facility-administered medications for  this visit.    Patient confirms/reports the following allergies:  Allergies  Allergen Reactions   Demerol [Meperidine] Hives   Tetracyclines & Related Hives   Simvastatin Other (See Comments)    Muscle pain    No orders of the defined types were placed in this encounter.   AUTHORIZATION INFORMATION Primary Insurance: 1D#: Group #:  Secondary Insurance: 1D#: Group #:  SCHEDULE INFORMATION: Date: 02/24/24 Time: Location: [facility]

## 2023-11-25 ENCOUNTER — Encounter: Payer: Self-pay | Admitting: Internal Medicine

## 2023-11-25 ENCOUNTER — Ambulatory Visit (INDEPENDENT_AMBULATORY_CARE_PROVIDER_SITE_OTHER): Payer: Medicare Other | Admitting: Internal Medicine

## 2023-11-25 VITALS — BP 116/60 | HR 83 | Ht 62.99 in | Wt 176.0 lb

## 2023-11-25 DIAGNOSIS — I10 Essential (primary) hypertension: Secondary | ICD-10-CM | POA: Diagnosis not present

## 2023-11-25 DIAGNOSIS — E118 Type 2 diabetes mellitus with unspecified complications: Secondary | ICD-10-CM

## 2023-11-25 DIAGNOSIS — Z1231 Encounter for screening mammogram for malignant neoplasm of breast: Secondary | ICD-10-CM

## 2023-11-25 DIAGNOSIS — Z7984 Long term (current) use of oral hypoglycemic drugs: Secondary | ICD-10-CM | POA: Diagnosis not present

## 2023-11-25 LAB — POCT GLYCOSYLATED HEMOGLOBIN (HGB A1C): Hemoglobin A1C: 6.9 % — AB (ref 4.0–5.6)

## 2023-11-25 MED ORDER — BLOOD GLUCOSE TEST VI STRP: 1.0000 | ORAL_STRIP | Freq: Every day | 0 refills | Status: AC

## 2023-11-25 MED ORDER — LANCETS MISC. MISC: 1.0000 | Freq: Three times a day (TID) | 0 refills | Status: AC

## 2023-11-25 MED ORDER — LANCET DEVICE MISC: 1.0000 | Freq: Three times a day (TID) | 0 refills | Status: AC

## 2023-11-25 MED ORDER — BLOOD GLUCOSE MONITORING SUPPL DEVI: 1.0000 | Freq: Three times a day (TID) | 0 refills | Status: AC

## 2023-11-25 NOTE — Progress Notes (Signed)
 Date:  11/25/2023   Name:  Farzana Koci   DOB:  12/28/1950   MRN:  [id number]   Chief Complaint: Diabetes and Hypertension  Diabetes She presents for her follow-up diabetic visit. She has type 2 diabetes mellitus. Pertinent negatives for hypoglycemia include no headaches, nervousness/anxiousness or tremors. Pertinent negatives for diabetes include no chest pain, no fatigue, no foot paresthesias, no polydipsia, no polyuria, no visual change and no weight loss. Symptoms are stable. Current diabetic treatment includes oral agent (monotherapy). She is compliant with treatment all of the time.  Hypertension This is a chronic problem. The problem is controlled. Pertinent negatives include no chest pain, headaches, palpitations or shortness of breath.    Review of Systems  Constitutional:  Negative for appetite change, fatigue, fever, unexpected weight change and weight loss.  HENT:  Negative for tinnitus and trouble swallowing.   Eyes:  Negative for visual disturbance.  Respiratory:  Negative for cough, chest tightness and shortness of breath.   Cardiovascular:  Negative for chest pain, palpitations and leg swelling.  Gastrointestinal:  Negative for abdominal pain.  Endocrine: Negative for polydipsia and polyuria.  Genitourinary:  Negative for dysuria and hematuria.  Musculoskeletal:  Negative for arthralgias.  Skin:  Negative for color change.  Neurological:  Negative for tremors, numbness and headaches.  Psychiatric/Behavioral:  Negative for dysphoric mood and sleep disturbance. The patient is not nervous/anxious.      Lab Results  Component Value Date   NA 141 07/28/2023   K 4.7 07/28/2023   CO2 22 07/28/2023   GLUCOSE 97 07/28/2023   BUN 21 07/28/2023   CREATININE 0.83 07/28/2023   CALCIUM  9.7 07/28/2023   EGFR 75 07/28/2023   GFRNONAA 67 03/06/2020   Lab Results  Component Value Date   CHOL 175 07/28/2023   HDL 34 (L) 07/28/2023   LDLCALC 91 07/28/2023   TRIG 301 (H)  07/28/2023   CHOLHDL 5.1 (H) 07/28/2023   Lab Results  Component Value Date   TSH 1.940 04/03/2023   Lab Results  Component Value Date   HGBA1C 6.9 (A) 11/25/2023   Lab Results  Component Value Date   WBC 6.0 04/03/2023   HGB 13.8 04/03/2023   HCT 42.6 04/03/2023   MCV 86 04/03/2023   PLT 226 04/03/2023   Lab Results  Component Value Date   ALT 21 07/28/2023   AST 19 07/28/2023   ALKPHOS 78 07/28/2023   BILITOT 0.2 07/28/2023   No results found for: MARIEN BOLLS, VD25OH   Patient Active Problem List   Diagnosis Date Noted   History of colonic polyps    Polyp of sigmoid colon    Stress incontinence of urine 02/25/2017   Hiatal hernia with GERD without esophagitis 09/06/2016   Fibrocystic breast changes of both breasts 09/06/2016   Intraductal papilloma of breast, left 09/06/2016   Hyperlipidemia associated with type 2 diabetes mellitus (HCC) 09/06/2016   Essential hypertension 09/06/2016   Type II diabetes mellitus with complication (HCC) 09/06/2016   Adenomatous colon polyp 09/06/2016    Allergies  Allergen Reactions   Demerol [Meperidine] Hives   Tetracyclines & Related Hives   Simvastatin Other (See Comments)    Muscle pain    Past Surgical History:  Procedure Laterality Date   brachial cleft cyst excison ( Right Neck )  1971   BREAST EXCISIONAL BIOPSY Left    intraductal papilloma   BREAST SURGERY  2007   L intraductal papilloma excision   CATARACT EXTRACTION W/PHACO Right  06/17/2023   Procedure: CATARACT EXTRACTION PHACO AND INTRAOCULAR LENS PLACEMENT (IOC) RIGHT DIABETIC 8.35 00:55.2;  Surgeon: Jaye Fallow, MD;  Location: [facility];  Service: Ophthalmology;  Laterality: Right;   CATARACT EXTRACTION W/PHACO Left 07/01/2023   Procedure: CATARACT EXTRACTION PHACO AND INTRAOCULAR LENS PLACEMENT (IOC) LEFT DIABETIC 5.26 00:37.3;  Surgeon: Jaye Fallow, MD;  Location: [facility];  Service: Ophthalmology;   Laterality: Left;   CESAREAN SECTION  1990   emergency   COLONOSCOPY  08/24/2013   tubular adenoma   COLONOSCOPY WITH PROPOFOL  N/A 02/05/2019   Procedure: COLONOSCOPY WITH BIOPSIES;  Surgeon: Jinny Carmine, MD;  Location: [facility];  Service: Endoscopy;  Laterality: N/A;  Diabetic - oral meds   ESOPHAGOGASTRODUODENOSCOPY  2005   hiatal hernia & polyp found   EXCISION / BIOPSY BREAST / NIPPLE / DUCT Left 2005   EYE SURGERY Bilateral 06/18/2023   Cataracts   LARYNGOSCOPY  2006   left intraductal papilloma excision of left breast  2007   POLYPECTOMY N/A 02/05/2019   Procedure: POLYPECTOMY;  Surgeon: Jinny Carmine, MD;  Location: [facility];  Service: Endoscopy;  Laterality: N/A;   TONSILECTOMY, ADENOIDECTOMY, BILATERAL MYRINGOTOMY AND TUBES      Social History   Tobacco Use   Smoking status: Never   Smokeless tobacco: Never   Tobacco comments:    smoking cessation materials not required  Vaping Use   Vaping status: Never Used  Substance Use Topics   Alcohol use: No   Drug use: No     Medication list has been reviewed and updated.  Current Meds  Medication Sig   Blood Glucose Monitoring Suppl DEVI 1 each by Does not apply route in the morning, at noon, and at bedtime. May substitute to any manufacturer covered by patient's insurance.   Glucose Blood (BLOOD GLUCOSE TEST STRIPS) STRP 1 each by In Vitro route daily. May substitute to any manufacturer covered by patient's insurance.   glucose blood test strip 1 each by Other route as needed for other. Use as instructed   Lancet Device MISC 1 each by Does not apply route in the morning, at noon, and at bedtime. May substitute to any manufacturer covered by patient's insurance.   Lancets Misc. MISC 1 each by Does not apply route in the morning, at noon, and at bedtime. May substitute to any manufacturer covered by patient's insurance.   levocetirizine (XYZAL) 5 MG tablet Take 5 mg by mouth every evening. PRN    lisinopril  (ZESTRIL ) 10 MG tablet Take 1 tablet (10 mg total) by mouth daily.   metFORMIN  (GLUCOPHAGE ) 500 MG tablet TAKE 1/2 TABLET DAILY   Naproxen Sodium (ALEVE PO) Take by mouth as needed.   omeprazole  (PRILOSEC) 20 MG capsule Take 1 capsule (20 mg total) by mouth 2 (two) times daily before a meal.   rosuvastatin  (CRESTOR ) 5 MG tablet TAKE 1 TABLET THREE TIMES  WEEKLY   tolterodine  (DETROL ) 2 MG tablet Take 1 tablet (2 mg total) by mouth 2 (two) times daily.       11/25/2023    9:48 AM 07/28/2023    3:45 PM 04/03/2023    8:01 AM 12/02/2022   10:16 AM  GAD 7 : Generalized Anxiety Score  Nervous, Anxious, on Edge 0 0 0 0  Control/stop worrying 0 0 0 0  Worry too much - different things 0 0 0 0  Trouble relaxing 0 0 0 0  Restless 0 0 0 0  Easily annoyed or irritable  0 0 0 0  Afraid - awful might happen 0 0 0 0  Total GAD 7 Score 0 0 0 0  Anxiety Difficulty Not difficult at all Not difficult at all Not difficult at all Not difficult at all       11/25/2023    9:47 AM 07/28/2023    3:44 PM 04/03/2023    8:01 AM  Depression screen PHQ 2/9  Decreased Interest 0 0 0  Down, Depressed, Hopeless 0 0 0  PHQ - 2 Score 0 0 0  Altered sleeping 0 0 0  Tired, decreased energy 0 0 0  Change in appetite 0 0 0  Feeling bad or failure about yourself  0 0 0  Trouble concentrating 0 0 0  Moving slowly or fidgety/restless 0 0 0  Suicidal thoughts 0 0 0  PHQ-9 Score 0 0 0  Difficult doing work/chores Not difficult at all Not difficult at all Not difficult at all    BP Readings from Last 3 Encounters:  11/25/23 116/60  07/28/23 114/78  07/01/23 113/62    Physical Exam Vitals and nursing note reviewed.  Constitutional:      General: She is not in acute distress.    Appearance: She is well-developed.  HENT:     Head: Normocephalic and atraumatic.  Neck:     Vascular: No carotid bruit.   Cardiovascular:     Rate and Rhythm: Normal rate and regular rhythm.  Pulmonary:     Effort:  Pulmonary effort is normal. No respiratory distress.   Musculoskeletal:     Cervical back: Normal range of motion.     Right lower leg: No edema.     Left lower leg: No edema.  Lymphadenopathy:     Cervical: No cervical adenopathy.   Skin:    General: Skin is warm and dry.     Capillary Refill: Capillary refill takes less than 2 seconds.     Findings: No rash.   Neurological:     General: No focal deficit present.     Mental Status: She is alert and oriented to person, place, and time.   Psychiatric:        Mood and Affect: Mood normal.        Behavior: Behavior normal.     Wt Readings from Last 3 Encounters:  11/25/23 176 lb (79.8 kg)  07/28/23 169 lb (76.7 kg)  07/01/23 169 lb (76.7 kg)    BP 116/60   Pulse 83   Ht 5' 2.99 (1.6 m)   Wt 176 lb (79.8 kg)   SpO2 97%   BMI 31.19 kg/m   Assessment and Plan:  Problem List Items Addressed This Visit       Unprioritized   Essential hypertension (Chronic)   Blood pressure is well controlled.  Current medications lisinopril . Will continue same regimen along with efforts to limit dietary sodium.       Type II diabetes mellitus with complication (HCC) - Primary (Chronic)   Blood sugars have been stable.  No recent hypoglycemic events requiring assistance. Currently medications are metformin . Lab Results  Component Value Date   HGBA1C 6.9 (H) 07/28/2023   Last visit no changes were made. A1C today = 6.9       Relevant Medications   Blood Glucose Monitoring Suppl DEVI   Glucose Blood (BLOOD GLUCOSE TEST STRIPS) STRP   Lancet Device MISC   Lancets Misc. MISC   Other Relevant Orders   POCT glycosylated hemoglobin (Hb A1C) (  Completed)   Other Visit Diagnoses       Encounter for screening mammogram for breast cancer       Relevant Orders   MM 3D SCREENING MAMMOGRAM BILATERAL BREAST     Long term current use of oral hypoglycemic drug           No follow-ups on file.    Leita HILARIO Adie, MD [facility] [place]

## 2023-11-25 NOTE — Assessment & Plan Note (Signed)
 Blood sugars have been stable.  No recent hypoglycemic events requiring assistance. Currently medications are metformin . Lab Results  Component Value Date   HGBA1C 6.9 (H) 07/28/2023   Last visit no changes were made. A1C today = 6.9

## 2023-11-25 NOTE — Patient Instructions (Signed)
 Call [facility] to schedule your mammogram at [phone].

## 2023-11-25 NOTE — Assessment & Plan Note (Signed)
 Blood pressure is well controlled.  Current medications lisinopril. Will continue same regimen along with efforts to limit dietary sodium.

## 2024-01-17 ENCOUNTER — Other Ambulatory Visit: Payer: Self-pay | Admitting: Internal Medicine

## 2024-01-17 DIAGNOSIS — E118 Type 2 diabetes mellitus with unspecified complications: Secondary | ICD-10-CM

## 2024-01-19 DIAGNOSIS — H53001 Unspecified amblyopia, right eye: Secondary | ICD-10-CM | POA: Diagnosis not present

## 2024-01-19 DIAGNOSIS — E119 Type 2 diabetes mellitus without complications: Secondary | ICD-10-CM | POA: Diagnosis not present

## 2024-01-19 LAB — HM DIABETES EYE EXAM

## 2024-01-20 NOTE — Telephone Encounter (Signed)
 Rx 04/03/23 #45 3RF- too soon Requested Prescriptions  Pending Prescriptions Disp Refills   metFORMIN  (GLUCOPHAGE ) 500 MG tablet [Pharmacy Med Name: METFORMIN  HCL 500 MG TABLET] 45 tablet 3    Sig: TAKE 1/2 TABLET BY MOUTH EVERY DAY     Endocrinology:  Diabetes - Biguanides Failed - 01/20/2024  2:16 PM      Failed - B12 Level in normal range and within 720 days    No results found for: VITAMINB12       Passed - Cr in normal range and within 360 days    Creatinine, Ser  Date Value Ref Range Status  07/28/2023 0.83 0.57 - 1.00 mg/dL Final         Passed - HBA1C is between 0 and 7.9 and within 180 days    Hemoglobin A1C  Date Value Ref Range Status  11/25/2023 6.9 (A) 4.0 - 5.6 % Final   Hgb A1c MFr Bld  Date Value Ref Range Status  07/28/2023 6.9 (H) 4.8 - 5.6 % Final    Comment:             Prediabetes: 5.7 - 6.4          Diabetes: >6.4          Glycemic control for adults with diabetes: <7.0          Passed - eGFR in normal range and within 360 days    GFR calc Af Amer  Date Value Ref Range Status  03/06/2020 77 >59 mL/min/1.73 Final    Comment:    **[facility] currently reports eGFR in compliance with the current**   recommendations of the [employer]. [facility] will   update reporting as new guidelines are published from the NKF-ASN   Task force.    GFR calc non Af Amer  Date Value Ref Range Status  03/06/2020 67 >59 mL/min/1.73 Final   eGFR  Date Value Ref Range Status  07/28/2023 75 >59 mL/min/1.73 Final         Passed - Valid encounter within last 6 months    Recent Outpatient Visits           1 month ago Type II diabetes mellitus with complication [facility])   [place] [facility] at [facility], Leita DEL, MD   5 months ago Essential hypertension   [place] [facility] at [facility], Leita DEL, MD       Future Appointments             In 2 months Justus Leita DEL, MD  [facility] at [facility]            Passed - CBC within normal limits and completed in the last 12 months    WBC  Date Value Ref Range Status  04/03/2023 6.0 3.4 - 10.8 x10E3/uL Final   RBC  Date Value Ref Range Status  04/03/2023 4.95 3.77 - 5.28 x10E6/uL Final  03/18/2018 0 (A) 4.04 - 5.48 M/uL Final   Hemoglobin  Date Value Ref Range Status  04/03/2023 13.8 11.1 - 15.9 g/dL Final   Hematocrit  Date Value Ref Range Status  04/03/2023 42.6 34.0 - 46.6 % Final   MCHC  Date Value Ref Range Status  04/03/2023 32.4 31.5 - 35.7 g/dL Final   [facility]  Date Value Ref Range Status  04/03/2023 27.9 26.6 - 33.0 pg Final   MCV  Date  Value Ref Range Status  04/03/2023 86 79 - 97 fL Final   No results found for: PLTCOUNTKUC, LABPLAT, POCPLA RDW  Date Value Ref Range Status  04/03/2023 13.7 11.7 - 15.4 % Final

## 2024-01-22 ENCOUNTER — Encounter: Payer: Self-pay | Admitting: Internal Medicine

## 2024-02-21 ENCOUNTER — Other Ambulatory Visit: Payer: Self-pay | Admitting: Internal Medicine

## 2024-02-23 ENCOUNTER — Other Ambulatory Visit: Payer: Self-pay

## 2024-02-23 DIAGNOSIS — Z860100 Personal history of colon polyps, unspecified: Secondary | ICD-10-CM

## 2024-02-23 NOTE — Telephone Encounter (Signed)
 Requested medication (s) are due for refill today -yes  Requested medication (s) are on the active medication list -yes  Future visit scheduled -yes  Last refill: 11/25/23  Notes to clinic: no protocol listed- sent for review   Requested Prescriptions  Pending Prescriptions Disp Refills   ACCU-CHEK GUIDE TEST test strip [Pharmacy Med Name: ACCU-CHEK GUIDE TEST STRIP] 100 strip     Sig: USE 1 STRIP DAILY FOR BLOOD SUGAR     There is no refill protocol information for this order       Requested Prescriptions  Pending Prescriptions Disp Refills   ACCU-CHEK GUIDE TEST test strip [Pharmacy Med Name: ACCU-CHEK GUIDE TEST STRIP] 100 strip     Sig: USE 1 STRIP DAILY FOR BLOOD SUGAR     There is no refill protocol information for this order

## 2024-02-24 ENCOUNTER — Other Ambulatory Visit: Payer: Self-pay | Admitting: Internal Medicine

## 2024-02-24 DIAGNOSIS — E1169 Type 2 diabetes mellitus with other specified complication: Secondary | ICD-10-CM

## 2024-02-24 NOTE — Telephone Encounter (Signed)
 Requested Prescriptions  Pending Prescriptions Disp Refills   rosuvastatin  (CRESTOR ) 5 MG tablet [Pharmacy Med Name: ROSUVASTATIN  TAB 5MG ] 36 tablet 1    Sig: TAKE 1 TABLET THREE TIMES  WEEKLY     Cardiovascular:  Antilipid - Statins 2 Failed - 02/24/2024  5:50 PM      Failed - Lipid Panel in normal range within the last 12 months    Cholesterol, Total  Date Value Ref Range Status  07/28/2023 175 100 - 199 mg/dL Final   LDL Chol Calc (NIH)  Date Value Ref Range Status  07/28/2023 91 0 - 99 mg/dL Final   HDL  Date Value Ref Range Status  07/28/2023 34 (L) >39 mg/dL Final   Triglycerides  Date Value Ref Range Status  07/28/2023 301 (H) 0 - 149 mg/dL Final         Passed - Cr in normal range and within 360 days    Creatinine, Ser  Date Value Ref Range Status  07/28/2023 0.83 0.57 - 1.00 mg/dL Final         Passed - Patient is not pregnant      Passed - Valid encounter within last 12 months    Recent Outpatient Visits           3 months ago Type II diabetes mellitus with complication [facility])   [place] [facility] at [facility], Leita DEL, MD   7 months ago Essential hypertension   [facility] at [facility], Leita DEL, MD       Future Appointments             In 1 month Justus, Leita DEL, MD [facility] at [facility], [phone] [place]

## 2024-02-25 ENCOUNTER — Encounter: Payer: Self-pay | Admitting: General Surgery

## 2024-02-25 ENCOUNTER — Ambulatory Visit: Admitting: Anesthesiology

## 2024-02-25 ENCOUNTER — Ambulatory Visit
Admission: RE | Admit: 2024-02-25 | Discharge: 2024-02-25 | Disposition: A | Discharge status: Home / Self Care | Attending: General Surgery | Admitting: General Surgery

## 2024-02-25 ENCOUNTER — Encounter
Admission: RE | Disposition: A | Discharge status: Home / Self Care | Payer: Self-pay | Source: Home / Self Care | Attending: General Surgery

## 2024-02-25 DIAGNOSIS — K5730 Diverticulosis of large intestine without perforation or abscess without bleeding: Secondary | ICD-10-CM | POA: Insufficient documentation

## 2024-02-25 DIAGNOSIS — K219 Gastro-esophageal reflux disease without esophagitis: Secondary | ICD-10-CM | POA: Insufficient documentation

## 2024-02-25 DIAGNOSIS — Z860100 Personal history of colon polyps, unspecified: Secondary | ICD-10-CM | POA: Diagnosis not present

## 2024-02-25 DIAGNOSIS — E119 Type 2 diabetes mellitus without complications: Secondary | ICD-10-CM | POA: Insufficient documentation

## 2024-02-25 DIAGNOSIS — D123 Benign neoplasm of transverse colon: Secondary | ICD-10-CM | POA: Insufficient documentation

## 2024-02-25 DIAGNOSIS — Z7984 Long term (current) use of oral hypoglycemic drugs: Secondary | ICD-10-CM | POA: Diagnosis not present

## 2024-02-25 DIAGNOSIS — K449 Diaphragmatic hernia without obstruction or gangrene: Secondary | ICD-10-CM | POA: Diagnosis not present

## 2024-02-25 DIAGNOSIS — I10 Essential (primary) hypertension: Secondary | ICD-10-CM | POA: Diagnosis not present

## 2024-02-25 DIAGNOSIS — Z1211 Encounter for screening for malignant neoplasm of colon: Secondary | ICD-10-CM | POA: Insufficient documentation

## 2024-02-25 DIAGNOSIS — K635 Polyp of colon: Secondary | ICD-10-CM | POA: Diagnosis not present

## 2024-02-25 DIAGNOSIS — K648 Other hemorrhoids: Secondary | ICD-10-CM | POA: Diagnosis not present

## 2024-02-25 HISTORY — PX: COLONOSCOPY: SHX5424

## 2024-02-25 HISTORY — PX: POLYPECTOMY: SHX149

## 2024-02-25 LAB — GLUCOSE, CAPILLARY: Glucose-Capillary: 137 mg/dL — ABNORMAL HIGH (ref 70–99)

## 2024-02-25 SURGERY — COLONOSCOPY
Anesthesia: General

## 2024-02-25 MED ORDER — PROPOFOL 500 MG/50ML IV EMUL
INTRAVENOUS | Status: DC | PRN
  Administered 2024-02-25: 150 ug/kg/min via INTRAVENOUS

## 2024-02-25 MED ORDER — SODIUM CHLORIDE 0.9 % IV SOLN: INTRAVENOUS | Status: DC

## 2024-02-25 MED ORDER — LIDOCAINE HCL (CARDIAC) PF 100 MG/5ML IV SOSY
PREFILLED_SYRINGE | INTRAVENOUS | Status: DC | PRN
  Administered 2024-02-25: 100 mg via INTRAVENOUS

## 2024-02-25 MED ORDER — PROPOFOL 10 MG/ML IV BOLUS
INTRAVENOUS | Status: DC | PRN
  Administered 2024-02-25: 100 mg via INTRAVENOUS

## 2024-02-25 MED ORDER — PROPOFOL 1000 MG/100ML IV EMUL
INTRAVENOUS | Status: AC
  Filled 2024-02-25: qty 100

## 2024-02-25 MED ORDER — LIDOCAINE HCL (PF) 2 % IJ SOLN
INTRAMUSCULAR | Status: AC
  Filled 2024-02-25: qty 5

## 2024-02-25 NOTE — Op Note (Signed)
 [facility] Gastroenterology Patient Name: Pamela Rosario Procedure Date: 02/25/2024 7:26 AM MRN: [id number] Account #: [id number] Date of Birth: 1951/05/14 Admit Type: Outpatient Age: 73 Room: [facility] ENDO ROOM 1 Gender: Female Note Status: Finalized Instrument Name: Colon Scope [id number] Procedure:             Colonoscopy Indications:           High risk colon cancer surveillance: Personal history                         of colonic polyps Providers:             Jayson KIDD. Marinda, MD Medicines:             See the Anesthesia note for documentation of the                         administered medications Complications:         No immediate complications. Estimated blood loss:                         Minimal. Procedure:             Pre-Anesthesia Assessment:                        - Prior to the procedure, a History and Physical was                         performed, and patient medications and allergies were                         reviewed. The patient's tolerance of previous                         anesthesia was also reviewed. The risks and benefits                         of the procedure and the sedation options and risks                         were discussed with the patient. All questions were                         answered, and informed consent was obtained. Prior                         Anticoagulants: The patient has taken no anticoagulant                         or antiplatelet agents. ASA Grade Assessment: II - A                         patient with mild systemic disease. After reviewing                         the risks and benefits, the patient was deemed in                         satisfactory condition to undergo the  procedure.                        After obtaining informed consent, the colonoscope was                         passed under direct vision. Throughout the procedure,                         the patient's blood pressure, pulse, and oxygen                          saturations were monitored continuously. The                         Colonoscope was introduced through the anus and                         advanced to the the cecum, identified by appendiceal                         orifice and ileocecal valve. The colonoscopy was                         performed with ease. The patient tolerated the                         procedure well. The quality of the bowel preparation                         was excellent. Findings:      The perianal and digital rectal examinations were normal.      A 4 mm polyp was found in the proximal transverse colon. The polyp was       sessile. The polyp was removed with a cold biopsy forceps. Resection and       retrieval were complete.      A few large-mouthed diverticula were found in the sigmoid colon. Impression:            - One 4 mm polyp in the proximal transverse colon,                         removed with a cold biopsy forceps. Resected and                         retrieved.                        - Diverticulosis in the sigmoid colon. Recommendation:        - Discharge patient to home (ambulatory).                        - Resume previous diet.                        - Repeat colonoscopy in 5 years for surveillance. Procedure Code(s):     --- Professional ---                        [phone], Colonoscopy, flexible; with biopsy, single or  multiple CPT copyright 2022 American Medical Association. All rights reserved. The codes documented in this report are preliminary and upon coder review may  be revised to meet current compliance requirements. Jayson MALVA Endow, MD 02/25/2024 8:35:38 AM Number of Addenda: 0 Note Initiated On: 02/25/2024 7:26 AM Scope Withdrawal Time: 0 hours 16 minutes 47 seconds  Total Procedure Duration: 0 hours 25 minutes 28 seconds  Estimated Blood Loss:  Estimated blood loss was minimal.      [facility]

## 2024-02-25 NOTE — Anesthesia Postprocedure Evaluation (Signed)
 Anesthesia Post Note  Patient: Pamela Rosario  Procedure(s) Performed: COLONOSCOPY POLYPECTOMY, INTESTINE  Patient location during evaluation: PACU Anesthesia Type: General Level of consciousness: awake and alert Pain management: pain level controlled Vital Signs Assessment: post-procedure vital signs reviewed and stable Respiratory status: spontaneous breathing, nonlabored ventilation, respiratory function stable and patient connected to nasal cannula oxygen Cardiovascular status: blood pressure returned to baseline and stable Postop Assessment: no apparent nausea or vomiting Anesthetic complications: no   No notable events documented.   Last Vitals:  Vitals:   02/25/24 0839 02/25/24 0849  BP: 98/73 121/83  Pulse: 77 69  Resp: 14 13  Temp:    SpO2: 97% 98%    Last Pain:  Vitals:   02/25/24 0849  TempSrc:   PainSc: 0-No pain                 Lynwood KANDICE Clause
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
## 2024-02-25 NOTE — Discharge Instructions (Signed)
 YOU HAD AN ENDOSCOPIC PROCEDURE TODAY: Refer to the procedure report that was given to you for any specific questions about what was found during the examination.  If the procedure report does not answer your questions, please call your gastroenterologist to clarify.  YOU SHOULD EXPECT: Some feelings of bloating in the abdomen. Passage of more gas than usual.  Walking can help get rid of the air that was put into your GI tract during the procedure and reduce the bloating. If you had a lower endoscopy (such as a colonoscopy or flexible sigmoidoscopy) you may notice spotting of blood in your stool or on the toilet paper.   DIET: Your first meal following the procedure should be a light meal and then it is ok to progress to your normal diet.  A half-sandwich or bowl of soup is an example of a good first meal.  Heavy or fried foods are harder to digest and may make you feel nasueas or bloated.  Drink plenty of fluids but you should avoid alcoholic beverages for 24 hours.  ACTIVITY: Your care partner should take you home directly after the procedure.  You should plan to take it easy, moving slowly for the rest of the day.  You can resume normal activity the day after the procedure however you should NOT DRIVE, make legal decisions or use heavy machinery for 24 hours (because of the sedation medicines used during the test).    SYMPTOMS TO REPORT IMMEDIATELY  A gastroenterologist can be reached at any hour.  Please call your doctor's office for any of the following symptoms:  Following lower endoscopy (colonoscopy, flexible sigmoidoscopy)  Excessive amounts of blood in the stool  Significant tenderness, worsening of abdominal pains  Swelling of the abdomen that is new, acute  Fever of 100 or higher Following upper endoscopy (EGD, EUS, ERCP)  Vomiting of blood or coffee ground material  New, significant abdominal pain  New, significant chest pain or pain under the shoulder blades  Painful or  persistently difficult swallowing  New shortness of breath  Black, tarry-looking stools  FOLLOW UP: If any biopsies were taken you will be contacted by phone or by letter within the next 1-3 weeks.  Call your gastroenterologist if you have not heard about the biopsies in 3 weeks.   Please also call your gastroenterologist's office with any specific questions about appointments or follow up tests.
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
## 2024-02-25 NOTE — Transfer of Care (Signed)
 Immediate Anesthesia Transfer of Care Note  Patient: Pamela Rosario  Procedure(s) Performed: COLONOSCOPY POLYPECTOMY, INTESTINE  Patient Location: Endoscopy Unit  Anesthesia Type:General  Level of Consciousness: drowsy and responds to stimulation  Airway & Oxygen Therapy: Patient Spontanous Breathing  Post-op Assessment: Report given to RN and Post -op Vital signs reviewed and stable  Post vital signs: Reviewed and stable  Last Vitals:  Vitals Value Taken Time  BP 90/55 02/25/24 08:33  Temp    Pulse 72 02/25/24 08:33  Resp 13 02/25/24 08:33  SpO2 94 % 02/25/24 08:33  Vitals shown include unfiled device data.  Last Pain:  Vitals:   02/25/24 0833  TempSrc:   PainSc: Asleep         Complications: No notable events documented.

## 2024-02-26 LAB — SURGICAL PATHOLOGY

## 2024-03-22 ENCOUNTER — Ambulatory Visit
Admission: RE | Admit: 2024-03-22 | Discharge: 2024-03-22 | Disposition: A | Discharge status: Home / Self Care | Source: Ambulatory Visit | Attending: Internal Medicine | Admitting: Internal Medicine

## 2024-03-22 DIAGNOSIS — Z1231 Encounter for screening mammogram for malignant neoplasm of breast: Secondary | ICD-10-CM | POA: Insufficient documentation

## 2024-03-24 ENCOUNTER — Ambulatory Visit: Payer: Self-pay | Admitting: *Deleted

## 2024-03-24 ENCOUNTER — Ambulatory Visit (INDEPENDENT_AMBULATORY_CARE_PROVIDER_SITE_OTHER): Admitting: Internal Medicine

## 2024-03-24 ENCOUNTER — Telehealth: Payer: Self-pay | Admitting: Internal Medicine

## 2024-03-24 VITALS — BP 112/70 | HR 96 | Temp 97.4°F | Ht 63.0 in | Wt 164.0 lb

## 2024-03-24 DIAGNOSIS — R10A2 Flank pain, left side: Secondary | ICD-10-CM | POA: Diagnosis not present

## 2024-03-24 DIAGNOSIS — K219 Gastro-esophageal reflux disease without esophagitis: Secondary | ICD-10-CM

## 2024-03-24 DIAGNOSIS — K449 Diaphragmatic hernia without obstruction or gangrene: Secondary | ICD-10-CM

## 2024-03-24 LAB — POCT URINALYSIS DIPSTICK
Bilirubin, UA: NEGATIVE
Blood, UA: NEGATIVE
Glucose, UA: NEGATIVE
Ketones, UA: NEGATIVE
Leukocytes, UA: NEGATIVE
Nitrite, UA: NEGATIVE
Protein, UA: NEGATIVE
Spec Grav, UA: 1.015 (ref 1.010–1.025)
Urobilinogen, UA: 0.2 U/dL
pH, UA: 5 (ref 5.0–8.0)

## 2024-03-24 MED ORDER — PANTOPRAZOLE SODIUM 40 MG PO TBEC: 40.0000 mg | DELAYED_RELEASE_TABLET | Freq: Every day | ORAL | 0 refills | Status: DC

## 2024-03-24 NOTE — Telephone Encounter (Signed)
 Noted  KP

## 2024-03-24 NOTE — Telephone Encounter (Signed)
 Recommended ED. Patient does not want to go and requesting appt. CAL notified.       FYI Only or Action Required?: Action required by provider: request for appointment and update on patient condition.  Patient was last seen in primary care on 11/25/2023 by Justus Leita DEL, MD.  Called Nurse Triage reporting Abdominal Pain.  Symptoms began today.  Interventions attempted: OTC medications: tylenol .  Symptoms are: gradually worsening.  Triage Disposition: Go to ED Now (Notify PCP)  Patient/caregiver understands and will follow disposition?: No, wishes to speak with PCP               Copied from CRM #[id number]. Topic: Clinical - Red Word Triage >> Mar 24, 2024  9:49 AM Antwanette L wrote: Red Word that prompted transfer to Nurse Triage:  Patient is experiencing pain in the left upper abdominal area and back, along with burning sensations and nausea. Reason for Disposition  [1] Pain lasts > 10 minutes AND [2] age > 23  Answer Assessment - Initial Assessment Questions Recommended ED due to sx of sweating nausea and continued left upper abdominal pain and back pain and pain taking in deep breath.  Denies chest pain no difficulty breathing .Patient reports most sx on going and does not want to go to ED. Wants to see PCP.  CAL notified.       1. LOCATION: Where does it hurt?      Left upper abdominal pain , hurts with taking deep breath 2. RADIATION: Does the pain shoot anywhere else? (e.g., chest, back)     Back started 2 days ago  3. ONSET: When did the pain begin? (e.g., minutes, hours or days ago)      6 months  4. SUDDEN: Gradual or sudden onset?     Gradual  5. PATTERN Does the pain come and go, or is it constant?     Constant since 5 am  6. SEVERITY: How bad is the pain?  (e.g., Scale 1-10; mild, moderate, or severe)     5/10 7. RECURRENT SYMPTOM: Have you ever had this type of stomach pain before? If Yes, ask: When was the last time? and  What happened that time?      On and off with burning pain in abdomen  8. AGGRAVATING FACTORS: Does anything seem to cause this pain? (e.g., foods, stress, alcohol)     na 9. CARDIAC SYMPTOMS: Do you have any of the following symptoms: chest pain, difficulty breathing, sweating, nausea?     Sweating , nausea  10. OTHER SYMPTOMS: Do you have any other symptoms? (e.g., back pain, diarrhea, fever, urination pain, vomiting)       Left upper abdominal pain and back pain, no chest pain, sweating, burning in abdomen , nausea, constant  11. PREGNANCY: Is there any chance you are pregnant? When was your last menstrual period?       na  Protocols used: Abdominal Pain - Upper-A-AH

## 2024-03-24 NOTE — Telephone Encounter (Signed)
 Copied from CRM [id number]. Topic: General - Other >> Mar 24, 2024 10:38 AM Turkey B wrote: Reason for CRM: Patient was told to go to ER earlier from NT for,  sweating nausea and continued left upper abdominal pain and back pain and pain taking in deep breath, but patient just called and said she is feeling better and doesn't need ER

## 2024-03-24 NOTE — Assessment & Plan Note (Signed)
 LUQ discomfort may be due to long term PPI use +/- recent Aleve use Continue to hold Aleve Change omeprazole  to pantoprazole for one month Add a probiotics Will follow up at visit in 2 weeks

## 2024-03-24 NOTE — Telephone Encounter (Signed)
 Called patient due to symptoms. Scheduled her to be seen today at 2:40 with Dr Justus.  - Pamela Rosario M.

## 2024-03-24 NOTE — Progress Notes (Signed)
 Date:  03/24/2024   Name:  Pamela Rosario   DOB:  1950-09-02   MRN:  [id number]   Chief Complaint: Abdominal Pain (Left upper abdominal pain that radiates into back on and off. Gradually worsening. Gassy, Bloated, and Belching.)  Abdominal Pain This is a recurrent problem. The current episode started more than 1 month ago. The onset quality is undetermined. The problem occurs intermittently (but worse this am). The problem has been waxing and waning. The pain is located in the LUQ, left flank and epigastric region. The pain is moderate. The quality of the pain is colicky and a sensation of fullness. The abdominal pain radiates to the epigastric region. Associated symptoms include belching. Pertinent negatives include no fever, nausea or vomiting.    Review of Systems  Constitutional:  Negative for chills, fatigue and fever.  Respiratory:  Negative for chest tightness and shortness of breath.   Cardiovascular:  Negative for chest pain.  Gastrointestinal:  Positive for abdominal pain. Negative for blood in stool, nausea and vomiting.  Psychiatric/Behavioral:  Negative for dysphoric mood and sleep disturbance. The patient is not nervous/anxious.      Lab Results  Component Value Date   NA 141 07/28/2023   K 4.7 07/28/2023   CO2 22 07/28/2023   GLUCOSE 97 07/28/2023   BUN 21 07/28/2023   CREATININE 0.83 07/28/2023   CALCIUM  9.7 07/28/2023   EGFR 75 07/28/2023   GFRNONAA 67 03/06/2020   Lab Results  Component Value Date   CHOL 175 07/28/2023   HDL 34 (L) 07/28/2023   LDLCALC 91 07/28/2023   TRIG 301 (H) 07/28/2023   CHOLHDL 5.1 (H) 07/28/2023   Lab Results  Component Value Date   TSH 1.940 04/03/2023   Lab Results  Component Value Date   HGBA1C 6.9 (A) 11/25/2023   Lab Results  Component Value Date   WBC 6.0 04/03/2023   HGB 13.8 04/03/2023   HCT 42.6 04/03/2023   MCV 86 04/03/2023   PLT 226 04/03/2023   Lab Results  Component Value Date   ALT 21 07/28/2023    AST 19 07/28/2023   ALKPHOS 78 07/28/2023   BILITOT 0.2 07/28/2023   No results found for: MARIEN BOLLS, VD25OH   Patient Active Problem List   Diagnosis Date Noted   History of colonic polyps    Polyp of sigmoid colon    Stress incontinence of urine 02/25/2017   Hiatal hernia with GERD without esophagitis 09/06/2016   Fibrocystic breast changes of both breasts 09/06/2016   Intraductal papilloma of breast, left 09/06/2016   Hyperlipidemia associated with type 2 diabetes mellitus (HCC) 09/06/2016   Essential hypertension 09/06/2016   Type II diabetes mellitus with complication (HCC) 09/06/2016   Adenomatous colon polyp 09/06/2016    Allergies  Allergen Reactions   Demerol [Meperidine] Hives   Tetracyclines & Related Hives   Simvastatin Other (See Comments)    Muscle pain    Past Surgical History:  Procedure Laterality Date   brachial cleft cyst excison ( Right Neck )  1971   BREAST EXCISIONAL BIOPSY Left    intraductal papilloma   BREAST SURGERY  2007   L intraductal papilloma excision   CATARACT EXTRACTION W/PHACO Right 06/17/2023   Procedure: CATARACT EXTRACTION PHACO AND INTRAOCULAR LENS PLACEMENT (IOC) RIGHT DIABETIC 8.35 00:55.2;  Surgeon: Jaye Fallow, MD;  Location: [facility];  Service: Ophthalmology;  Laterality: Right;   CATARACT EXTRACTION W/PHACO Left 07/01/2023   Procedure: CATARACT EXTRACTION PHACO AND INTRAOCULAR  LENS PLACEMENT (IOC) LEFT DIABETIC 5.26 00:37.3;  Surgeon: Jaye Fallow, MD;  Location: [facility];  Service: Ophthalmology;  Laterality: Left;   CESAREAN SECTION  1990   emergency   COLONOSCOPY  08/24/2013   tubular adenoma   COLONOSCOPY N/A 02/25/2024   Procedure: COLONOSCOPY;  Surgeon: Marinda Jayson KIDD, MD;  Location: [facility] END[facility]COPY;  Service: General;  Laterality: N/A;   COLONOSCOPY WITH PROPOFOL  N/A 02/05/2019   Procedure: COLONOSCOPY WITH BIOPSIES;  Surgeon: Jinny Carmine, MD;  Location: [facility]  [facility];  Service: Endoscopy;  Laterality: N/A;  Diabetic - oral meds   ESOPHAGOGASTRODUODENOSCOPY  2005   hiatal hernia & polyp found   EXCISION / BIOPSY BREAST / NIPPLE / DUCT Left 2005   EYE SURGERY Bilateral 06/18/2023   Cataracts   LARYNGOSCOPY  2006   left intraductal papilloma excision of left breast  2007   POLYPECTOMY N/A 02/05/2019   Procedure: POLYPECTOMY;  Surgeon: Jinny Carmine, MD;  Location: [facility];  Service: Endoscopy;  Laterality: N/A;   POLYPECTOMY  02/25/2024   Procedure: POLYPECTOMY, INTESTINE;  Surgeon: Marinda Jayson KIDD, MD;  Location: [facility] ENDOSCOPY;  Service: General;;   TONSILECTOMY, ADENOIDECTOMY, BILATERAL MYRINGOTOMY AND TUBES      Social History   Tobacco Use   Smoking status: Never   Smokeless tobacco: Never   Tobacco comments:    smoking cessation materials not required  Vaping Use   Vaping status: Never Used  Substance Use Topics   Alcohol use: No   Drug use: No     Medication list has been reviewed and updated.  Current Meds  Medication Sig   Blood Glucose Monitoring Suppl DEVI 1 each by Does not apply route in the morning, at noon, and at bedtime. May substitute to any manufacturer covered by patient's insurance.   glucose blood (ACCU-CHEK GUIDE TEST) test strip USE 1 STRIP DAILY FOR BLOOD SUGAR   glucose blood test strip 1 each by Other route as needed for other. Use as instructed   levocetirizine (XYZAL) 5 MG tablet Take 5 mg by mouth every evening. PRN   lisinopril  (ZESTRIL ) 10 MG tablet Take 1 tablet (10 mg total) by mouth daily.   metFORMIN  (GLUCOPHAGE ) 500 MG tablet TAKE 1/2 TABLET DAILY   Naproxen Sodium (ALEVE PO) Take by mouth as needed.   pantoprazole (PROTONIX) 40 MG tablet Take 1 tablet (40 mg total) by mouth daily.   rosuvastatin  (CRESTOR ) 5 MG tablet TAKE 1 TABLET THREE TIMES  WEEKLY   tolterodine  (DETROL ) 2 MG tablet Take 1 tablet (2 mg total) by mouth 2 (two) times daily.   [DISCONTINUED] omeprazole  (PRILOSEC) 20 MG  capsule Take 1 capsule (20 mg total) by mouth 2 (two) times daily before a meal.       03/24/2024    2:25 PM 11/25/2023    9:48 AM 07/28/2023    3:45 PM 04/03/2023    8:01 AM  GAD 7 : Generalized Anxiety Score  Nervous, Anxious, on Edge 0 0 0 0  Control/stop worrying 0 0 0 0  Worry too much - different things 0 0 0 0  Trouble relaxing 0 0 0 0  Restless 0 0 0 0  Easily annoyed or irritable 0 0 0 0  Afraid - awful might happen 0 0 0 0  Total GAD 7 Score 0 0 0 0  Anxiety Difficulty Not difficult at all Not difficult at all Not difficult at all Not difficult at all  03/24/2024    2:25 PM 11/25/2023    9:47 AM 07/28/2023    3:44 PM  Depression screen PHQ 2/9  Decreased Interest 0 0 0  Down, Depressed, Hopeless 0 0 0  PHQ - 2 Score 0 0 0  Altered sleeping 0 0 0  Tired, decreased energy 0 0 0  Change in appetite 0 0 0  Feeling bad or failure about yourself  0 0 0  Trouble concentrating 0 0 0  Moving slowly or fidgety/restless 0 0 0  Suicidal thoughts 0 0 0  PHQ-9 Score 0 0 0  Difficult doing work/chores Not difficult at all Not difficult at all Not difficult at all    BP Readings from Last 3 Encounters:  03/24/24 112/70  02/25/24 121/83  11/25/23 116/60    Physical Exam Vitals and nursing note reviewed.  Constitutional:      General: She is not in acute distress.    Appearance: She is well-developed.  HENT:     Head: Normocephalic and atraumatic.  Pulmonary:     Effort: Pulmonary effort is normal. No respiratory distress.  Abdominal:     General: Abdomen is flat. Bowel sounds are normal.     Palpations: Abdomen is soft.     Tenderness: There is abdominal tenderness in the epigastric area. There is no right CVA tenderness, left CVA tenderness, guarding or rebound. Negative signs include Murphy's sign.  Skin:    General: Skin is warm and dry.     Findings: No rash.  Neurological:     Mental Status: She is alert and oriented to person, place, and time.   Psychiatric:        Mood and Affect: Mood normal.        Behavior: Behavior normal.     Wt Readings from Last 3 Encounters:  03/24/24 164 lb (74.4 kg)  02/25/24 161 lb (73 kg)  11/25/23 176 lb (79.8 kg)    BP 112/70   Pulse 96   Temp (!) 97.4 F (36.3 C) (Oral)   Ht 5' 3 (1.6 m)   Wt 164 lb (74.4 kg)   SpO2 98%   BMI 29.05 kg/m   Assessment and Plan:  Problem List Items Addressed This Visit       Unprioritized   Hiatal hernia with GERD without esophagitis (Chronic)   LUQ discomfort may be due to long term PPI use +/- recent Aleve use Continue to hold Aleve Change omeprazole  to pantoprazole for one month Add a probiotics Will follow up at visit in 2 weeks      Relevant Medications   pantoprazole (PROTONIX) 40 MG tablet   Other Visit Diagnoses       Flank pain, left side    -  Primary   Relevant Orders   POCT urinalysis dipstick       No follow-ups on file.    Leita HILARIO Adie, MD [facility] [place]

## 2024-03-24 NOTE — Telephone Encounter (Signed)
 FYI  KP

## 2024-03-31 ENCOUNTER — Ambulatory Visit: Payer: Self-pay

## 2024-03-31 VITALS — BP 120/64 | Ht 63.0 in | Wt 168.8 lb

## 2024-03-31 DIAGNOSIS — Z0000 Encounter for general adult medical examination without abnormal findings: Secondary | ICD-10-CM

## 2024-03-31 DIAGNOSIS — Z780 Asymptomatic menopausal state: Secondary | ICD-10-CM | POA: Diagnosis not present
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
## 2024-03-31 NOTE — Patient Instructions (Addendum)
 Pamela Rosario,  Thank you for taking the time for your Medicare Wellness Visit. I appreciate your continued commitment to your health goals. Please review the care plan we discussed, and feel free to reach out if I can assist you further.  Medicare recommends these wellness visits once per year to help you and your care team stay ahead of potential health issues. These visits are designed to focus on prevention, allowing your provider to concentrate on managing your acute and chronic conditions during your regular appointments.  Please note that Annual Wellness Visits do not include a physical exam. Some assessments may be limited, especially if the visit was conducted virtually. If needed, we may recommend a separate in-person follow-up with your provider.  Ongoing Care Seeing your primary care provider every 3 to 6 months helps us  monitor your health and provide consistent, personalized care.   Referrals If a referral was made during today's visit and you haven't received any updates within two weeks, please contact the referred provider directly to check on the status.  REFERRAL FOR BONE DENSITY SCAN SENT You have an order for:  []   2D Mammogram  []   3D Mammogram  [x]   Bone Density     Please call for appointment:  [facility]  [address] [place] [address] [phone] [facility] [address] [place], [place] [address] [phone] [facility] [address] [place], [place] [address] [phone]   Make sure to wear two-piece clothing.  No lotions, powders, or deodorants the day of the appointment. Make sure to bring picture ID and insurance card.  Bring list of medications you are currently taking including any supplements.   Schedule your  screening mammogram through MyChart!   Log into your MyChart account.  Go to 'Visit'  (or 'Appointments' if on mobile App) --> Schedule an Appointment  Under 'Select a Reason for Visit' choose the Mammogram Screening option.  Complete the pre-visit questions and select the time and place that best fits your schedule.   Recommended Screenings:  Health Maintenance  Topic Date Due   COVID-19 Vaccine (1) Never done   DTaP/Tdap/Td vaccine (2 - Td or Tdap) 06/03/2022   Yearly kidney health urinalysis for diabetes  12/02/2023   Pneumococcal Vaccine for age over 81 (3 of 3 - PCV20 or PCV21) 07/27/2024*   Flu Shot  08/31/2024*   Complete foot exam   04/02/2024   DEXA scan (bone density measurement)  05/03/2024   Hemoglobin A1C  05/26/2024   Yearly kidney function blood test for diabetes  07/27/2024   Eye exam for diabetics  01/18/2025   Breast Cancer Screening  03/22/2025   Medicare Annual Wellness Visit  03/31/2025   Colon Cancer Screening  02/24/2029   Hepatitis C Screening  Completed   Zoster (Shingles) Vaccine  Completed   Meningitis B Vaccine  Aged Out   Hepatitis B Vaccine  Discontinued  *Topic was postponed. The date shown is not the original due date.     Advance Care Planning is important because it: Ensures you receive medical care that aligns with your values, goals, and preferences. Provides guidance to your family and loved ones, reducing the emotional burden of decision-making during critical moments.  Vision: Annual vision screenings are recommended for early detection of glaucoma, cataracts, and diabetic retinopathy. These exams can also reveal signs of chronic conditions such as diabetes  and high blood pressure.  Dental: Annual dental screenings help detect early signs of oral cancer, gum disease, and other conditions linked to overall health, including heart disease and diabetes.  Please see the attached documents for additional preventive care recommendations.   NEXT AWV 04/06/25 @ 1:20 PM IN PERSON

## 2024-04-05 ENCOUNTER — Other Ambulatory Visit: Payer: Self-pay | Admitting: Internal Medicine

## 2024-04-05 DIAGNOSIS — K219 Gastro-esophageal reflux disease without esophagitis: Secondary | ICD-10-CM

## 2024-04-06 ENCOUNTER — Ambulatory Visit (INDEPENDENT_AMBULATORY_CARE_PROVIDER_SITE_OTHER): Payer: Self-pay | Admitting: Internal Medicine

## 2024-04-06 ENCOUNTER — Encounter: Payer: Self-pay | Admitting: Internal Medicine

## 2024-04-06 VITALS — BP 112/64 | HR 87 | Ht 63.0 in | Wt 168.8 lb

## 2024-04-06 DIAGNOSIS — E1169 Type 2 diabetes mellitus with other specified complication: Secondary | ICD-10-CM

## 2024-04-06 DIAGNOSIS — G720 Drug-induced myopathy: Secondary | ICD-10-CM | POA: Insufficient documentation

## 2024-04-06 DIAGNOSIS — E118 Type 2 diabetes mellitus with unspecified complications: Secondary | ICD-10-CM

## 2024-04-06 DIAGNOSIS — K449 Diaphragmatic hernia without obstruction or gangrene: Secondary | ICD-10-CM | POA: Diagnosis not present

## 2024-04-06 DIAGNOSIS — N393 Stress incontinence (female) (male): Secondary | ICD-10-CM | POA: Diagnosis not present

## 2024-04-06 DIAGNOSIS — I10 Essential (primary) hypertension: Secondary | ICD-10-CM

## 2024-04-06 DIAGNOSIS — E785 Hyperlipidemia, unspecified: Secondary | ICD-10-CM | POA: Diagnosis not present

## 2024-04-06 DIAGNOSIS — K219 Gastro-esophageal reflux disease without esophagitis: Secondary | ICD-10-CM | POA: Diagnosis not present

## 2024-04-06 DIAGNOSIS — Z7984 Long term (current) use of oral hypoglycemic drugs: Secondary | ICD-10-CM | POA: Diagnosis not present

## 2024-04-06 MED ORDER — OMEPRAZOLE 20 MG PO CPDR: 20.0000 mg | DELAYED_RELEASE_CAPSULE | Freq: Every day | ORAL | 1 refills | Status: AC

## 2024-04-06 MED ORDER — LISINOPRIL 10 MG PO TABS
10.0000 mg | ORAL_TABLET | Freq: Every day | ORAL | 1 refills | Status: AC
Start: 2024-04-06 — End: ?

## 2024-04-06 MED ORDER — TOLTERODINE TARTRATE 2 MG PO TABS: 2.0000 mg | ORAL_TABLET | Freq: Two times a day (BID) | ORAL | 1 refills | Status: DC

## 2024-04-06 MED ORDER — METFORMIN HCL 500 MG PO TABS: ORAL_TABLET | ORAL | 1 refills | Status: DC

## 2024-04-06 NOTE — Assessment & Plan Note (Addendum)
 LDL is  Lab Results  Component Value Date   LDLCALC 91 07/28/2023    Current medication regimen is none.  Was previously on Crestor  but stopped due to abdominal pain and itching. Goal LDL is < 70. Will check labs and advise.  She prefers to work on diet alone for now.

## 2024-04-06 NOTE — Patient Instructions (Signed)
 [name],  We hope you enjoyed your visit with our office! Your feedback means so much to our team, and it helps us  to continue providing the best care possible. If you had a positive experience, we'd love if you could share it by leaving us  a [employer] Review and also completing our patient survey that you'll receive soon.  Your kind words not only brighten our day but also help other patients feel confident in choosing our office for their care.  Thank you for being a part of our practice family!   Dr. Leita Adie & Eual Grieves, CMA, [facility]  Primary Care & Sports Medicine [facility] [address]  [place] [address] Office [phone]  Fax: [phone]'

## 2024-04-06 NOTE — Assessment & Plan Note (Signed)
 Currently taking omeprazole  with minimal reflux symptoms. Patient denies red flag symptoms - no melena, weight loss, dysphagia. She does have occasional water  brash at night Will maintain current management.

## 2024-04-06 NOTE — Assessment & Plan Note (Signed)
 Well controlled blood pressure today. Current regimen is lisinopril . No medication side effects noted.

## 2024-04-06 NOTE — Assessment & Plan Note (Signed)
 Well controlled on Detrol  2 mg bid

## 2024-04-06 NOTE — Assessment & Plan Note (Signed)
 Currently medications are MTF.  No hypoglycemic episodes noted. Home blood sugars in the 130 range. Last visit medical regimen changes were none. Lab Results  Component Value Date   HGBA1C 6.9 (A) 11/25/2023

## 2024-04-06 NOTE — Progress Notes (Addendum)
 Date:  04/06/2024   Name:  Pamela Rosario   DOB:  12-08-1950   MRN:  [id number]   Chief Complaint: Annual Exam Pamela Rosario is a 73 y.o. female who presents today for her Complete Annual Exam. She feels well. She reports exercising . She reports she is sleeping well. Breast complaints - none.  Health Maintenance  Topic Date Due   COVID-19 Vaccine (1) Never done   DTaP/Tdap/Td vaccine (2 - Td or Tdap) 06/03/2022   Yearly kidney health urinalysis for diabetes  12/02/2023   Pneumococcal Vaccine for age over 54 (3 of 3 - PCV20 or PCV21) 07/27/2024*   Flu Shot  08/31/2024*   DEXA scan (bone density measurement)  05/03/2024   Hemoglobin A1C  05/26/2024   Yearly kidney function blood test for diabetes  07/27/2024   Eye exam for diabetics  01/18/2025   Breast Cancer Screening  03/22/2025   Medicare Annual Wellness Visit  03/31/2025   Complete foot exam   04/06/2025   Colon Cancer Screening  02/24/2029   Hepatitis C Screening  Completed   Zoster (Shingles) Vaccine  Completed   Meningitis B Vaccine  Aged Out   Hepatitis B Vaccine  Discontinued  *Topic was postponed. The date shown is not the original due date.    Hypertension This is a chronic problem. The problem is controlled. Pertinent negatives include no chest pain, headaches, palpitations or shortness of breath. Past treatments include ACE inhibitors. The current treatment provides significant improvement. There is no history of kidney disease, CAD/MI or CVA.  Diabetes She presents for her follow-up diabetic visit. She has type 2 diabetes mellitus. Pertinent negatives for hypoglycemia include no dizziness, headaches or nervousness/anxiousness. Pertinent negatives for diabetes include no chest pain, no fatigue and no weakness. Pertinent negatives for diabetic complications include no CVA. Current diabetic treatment includes oral agent (monotherapy). She is compliant with treatment all of the time.  Gastroesophageal Reflux She  complains of heartburn. She reports no abdominal pain, no chest pain, no coughing or no wheezing. This is a recurrent problem. The problem occurs occasionally. Pertinent negatives include no fatigue. She has tried a PPI for the symptoms.  Hyperlipidemia This is a chronic problem. The problem is controlled. Pertinent negatives include no chest pain, myalgias or shortness of breath. She is currently on no antihyperlipidemic treatment. Compliance problems include medication side effects (to several statins).     Review of Systems  Constitutional:  Negative for fatigue and unexpected weight change.  HENT:  Negative for trouble swallowing.   Eyes:  Negative for visual disturbance.  Respiratory:  Negative for cough, chest tightness, shortness of breath and wheezing.   Cardiovascular:  Negative for chest pain, palpitations and leg swelling.  Gastrointestinal:  Positive for heartburn. Negative for abdominal pain, constipation and diarrhea.  Genitourinary:  Negative for dysuria and hematuria.  Musculoskeletal:  Negative for arthralgias and myalgias.  Neurological:  Negative for dizziness, weakness, light-headedness and headaches.  Psychiatric/Behavioral:  Negative for dysphoric mood and sleep disturbance. The patient is not nervous/anxious.      Lab Results  Component Value Date   NA 141 07/28/2023   K 4.7 07/28/2023   CO2 22 07/28/2023   GLUCOSE 97 07/28/2023   BUN 21 07/28/2023   CREATININE 0.83 07/28/2023   CALCIUM  9.7 07/28/2023   EGFR 75 07/28/2023   GFRNONAA 67 03/06/2020   Lab Results  Component Value Date   CHOL 175 07/28/2023   HDL 34 (L) 07/28/2023   LDLCALC 91  07/28/2023   TRIG 301 (H) 07/28/2023   CHOLHDL 5.1 (H) 07/28/2023   Lab Results  Component Value Date   TSH 1.940 04/03/2023   Lab Results  Component Value Date   HGBA1C 6.9 (A) 11/25/2023   Lab Results  Component Value Date   WBC 6.0 04/03/2023   HGB 13.8 04/03/2023   HCT 42.6 04/03/2023   MCV 86 04/03/2023    PLT 226 04/03/2023   Lab Results  Component Value Date   ALT 21 07/28/2023   AST 19 07/28/2023   ALKPHOS 78 07/28/2023   BILITOT 0.2 07/28/2023   No results found for: MARIEN BOLLS, VD25OH   Patient Active Problem List   Diagnosis Date Noted   Statin myopathy 04/06/2024   History of colonic polyps    Polyp of sigmoid colon    Stress incontinence of urine 02/25/2017   Hiatal hernia with GERD without esophagitis 09/06/2016   Fibrocystic breast changes of both breasts 09/06/2016   Intraductal papilloma of breast, left 09/06/2016   Hyperlipidemia associated with type 2 diabetes mellitus (HCC) 09/06/2016   Essential hypertension 09/06/2016   Type II diabetes mellitus with complication (HCC) 09/06/2016   Adenomatous colon polyp 09/06/2016    Allergies  Allergen Reactions   Demerol [Meperidine] Hives   Tetracyclines & Related Hives   Simvastatin Other (See Comments)    Muscle pain    Past Surgical History:  Procedure Laterality Date   brachial cleft cyst excison ( Right Neck )  1971   BREAST EXCISIONAL BIOPSY Left    intraductal papilloma   BREAST SURGERY  2007   L intraductal papilloma excision   CATARACT EXTRACTION W/PHACO Right 06/17/2023   Procedure: CATARACT EXTRACTION PHACO AND INTRAOCULAR LENS PLACEMENT (IOC) RIGHT DIABETIC 8.35 00:55.2;  Surgeon: Jaye Fallow, MD;  Location: [facility];  Service: Ophthalmology;  Laterality: Right;   CATARACT EXTRACTION W/PHACO Left 07/01/2023   Procedure: CATARACT EXTRACTION PHACO AND INTRAOCULAR LENS PLACEMENT (IOC) LEFT DIABETIC 5.26 00:37.3;  Surgeon: Jaye Fallow, MD;  Location: [facility];  Service: Ophthalmology;  Laterality: Left;   CESAREAN SECTION  1990   emergency   COLONOSCOPY  08/24/2013   tubular adenoma   COLONOSCOPY N/A 02/25/2024   Procedure: COLONOSCOPY;  Surgeon: Marinda Jayson KIDD, MD;  Location: [facility] ENDOSCOPY;  Service: General;  Laterality: N/A;   COLONOSCOPY WITH  PROPOFOL  N/A 02/05/2019   Procedure: COLONOSCOPY WITH BIOPSIES;  Surgeon: Jinny Carmine, MD;  Location: [facility];  Service: Endoscopy;  Laterality: N/A;  Diabetic - oral meds   ESOPHAGOGASTRODUODENOSCOPY  2005   hiatal hernia & polyp found   EXCISION / BIOPSY BREAST / NIPPLE / DUCT Left 2005   EYE SURGERY Bilateral 06/18/2023   Cataracts   LARYNGOSCOPY  2006   left intraductal papilloma excision of left breast  2007   POLYPECTOMY N/A 02/05/2019   Procedure: POLYPECTOMY;  Surgeon: Jinny Carmine, MD;  Location: [facility];  Service: Endoscopy;  Laterality: N/A;   POLYPECTOMY  02/25/2024   Procedure: POLYPECTOMY, INTESTINE;  Surgeon: Marinda Jayson KIDD, MD;  Location: [facility] ENDOSCOPY;  Service: General;;   TONSILECTOMY, ADENOIDECTOMY, BILATERAL MYRINGOTOMY AND TUBES      Social History   Tobacco Use   Smoking status: Never   Smokeless tobacco: Never   Tobacco comments:    smoking cessation materials not required  Vaping Use   Vaping status: Never Used  Substance Use Topics   Alcohol use: No   Drug use: No     Medication  list has been reviewed and updated.  Current Meds  Medication Sig   Blood Glucose Monitoring Suppl DEVI 1 each by Does not apply route in the morning, at noon, and at bedtime. May substitute to any manufacturer covered by patient's insurance.   glucose blood (ACCU-CHEK GUIDE TEST) test strip USE 1 STRIP DAILY FOR BLOOD SUGAR   glucose blood test strip 1 each by Other route as needed for other. Use as instructed   levocetirizine (XYZAL) 5 MG tablet Take 5 mg by mouth every evening. PRN   Naproxen Sodium (ALEVE PO) Take by mouth as needed.   [DISCONTINUED] lisinopril  (ZESTRIL ) 10 MG tablet Take 1 tablet (10 mg total) by mouth daily.   [DISCONTINUED] metFORMIN  (GLUCOPHAGE ) 500 MG tablet TAKE 1/2 TABLET DAILY   [DISCONTINUED] omeprazole  (PRILOSEC) 20 MG capsule Take 20 mg by mouth daily.   [DISCONTINUED] pantoprazole (PROTONIX) 40 MG tablet Take 1  tablet (40 mg total) by mouth daily.   [DISCONTINUED] tolterodine  (DETROL ) 2 MG tablet Take 1 tablet (2 mg total) by mouth 2 (two) times daily.       04/06/2024    7:51 AM 03/24/2024    2:25 PM 11/25/2023    9:48 AM 07/28/2023    3:45 PM  GAD 7 : Generalized Anxiety Score  Nervous, Anxious, on Edge 0 0 0 0  Control/stop worrying 0 0 0 0  Worry too much - different things 0 0 0 0  Trouble relaxing 0 0 0 0  Restless 0 0 0 0  Easily annoyed or irritable 0 0 0 0  Afraid - awful might happen 0 0 0 0  Total GAD 7 Score 0 0 0 0  Anxiety Difficulty Not difficult at all Not difficult at all Not difficult at all Not difficult at all       04/06/2024    7:51 AM 03/31/2024    1:30 PM 03/24/2024    2:25 PM  Depression screen PHQ 2/9  Decreased Interest 0 0 0  Down, Depressed, Hopeless 0 0 0  PHQ - 2 Score 0 0 0  Altered sleeping 0 0 0  Tired, decreased energy 0 0 0  Change in appetite 0 0 0  Feeling bad or failure about yourself  0 0 0  Trouble concentrating 0 0 0  Moving slowly or fidgety/restless 0 0 0  Suicidal thoughts 0 0 0  PHQ-9 Score 0 0 0  Difficult doing work/chores Not difficult at all Not difficult at all Not difficult at all    BP Readings from Last 3 Encounters:  04/06/24 112/64  03/31/24 120/64  03/24/24 112/70    Physical Exam Vitals and nursing note reviewed.  Constitutional:      General: She is not in acute distress.    Appearance: She is well-developed.  HENT:     Head: Normocephalic and atraumatic.     Right Ear: Tympanic membrane and ear canal normal.     Left Ear: Tympanic membrane and ear canal normal.     Nose:     Right Sinus: No maxillary sinus tenderness.     Left Sinus: No maxillary sinus tenderness.  Eyes:     General: No scleral icterus.       Right eye: No discharge.        Left eye: No discharge.     Conjunctiva/sclera: Conjunctivae normal.  Neck:     Thyroid : No thyromegaly.     Vascular: No carotid bruit.  Cardiovascular:  Rate  and Rhythm: Normal rate and regular rhythm.     Pulses: Normal pulses.     Heart sounds: Normal heart sounds.  Pulmonary:     Effort: Pulmonary effort is normal. No respiratory distress.     Breath sounds: No wheezing.  Abdominal:     General: Bowel sounds are normal.     Palpations: Abdomen is soft.     Tenderness: There is no abdominal tenderness.  Musculoskeletal:     Cervical back: Normal range of motion. No erythema.     Right lower leg: No edema.     Left lower leg: No edema.  Lymphadenopathy:     Cervical: No cervical adenopathy.  Skin:    General: Skin is warm and dry.     Capillary Refill: Capillary refill takes less than 2 seconds.     Findings: No rash.  Neurological:     General: No focal deficit present.     Mental Status: She is alert and oriented to person, place, and time.     Cranial Nerves: No cranial nerve deficit.     Sensory: No sensory deficit.     Deep Tendon Reflexes: Reflexes are normal and symmetric.  Psychiatric:        Attention and Perception: Attention normal.        Mood and Affect: Mood normal.    Diabetic Foot Exam - Simple   Simple Foot Form Diabetic Foot exam was performed with the following findings: Yes 04/06/2024  8:12 AM  Visual Inspection No deformities, no ulcerations, no other skin breakdown bilaterally: Yes Sensation Testing Intact to touch and monofilament testing bilaterally: Yes Pulse Check Posterior Tibialis and Dorsalis pulse intact bilaterally: Yes Comments      Wt Readings from Last 3 Encounters:  04/06/24 168 lb 12.8 oz (76.6 kg)  03/31/24 168 lb 12.8 oz (76.6 kg)  03/24/24 164 lb (74.4 kg)    BP 112/64   Pulse 87   Ht 5' 3 (1.6 m)   Wt 168 lb 12.8 oz (76.6 kg)   SpO2 98%   BMI 29.90 kg/m   Assessment and Plan:  Problem List Items Addressed This Visit       Unprioritized   Hiatal hernia with GERD without esophagitis (Chronic)   Currently taking omeprazole  with minimal reflux symptoms. Patient denies  red flag symptoms - no melena, weight loss, dysphagia. She does have occasional water  brash at night Will maintain current management.       Relevant Medications   omeprazole  (PRILOSEC) 20 MG capsule   Other Relevant Orders   CBC with Differential/Platelet   Hyperlipidemia associated with type 2 diabetes mellitus (HCC) (Chronic)   LDL is  Lab Results  Component Value Date   LDLCALC 91 07/28/2023    Current medication regimen is none.  Was previously on Crestor  but stopped due to abdominal pain and itching. Goal LDL is < 70. Will check labs and advise.  She prefers to work on diet alone for now.       Relevant Medications   lisinopril  (ZESTRIL ) 10 MG tablet   metFORMIN  (GLUCOPHAGE ) 500 MG tablet   Other Relevant Orders   Lipid panel   Essential hypertension - Primary (Chronic)   Well controlled blood pressure today. Current regimen is lisinopril . No medication side effects noted.        Relevant Medications   lisinopril  (ZESTRIL ) 10 MG tablet   Other Relevant Orders   CBC with Differential/Platelet   Comprehensive metabolic panel with  GFR   TSH   Type II diabetes mellitus with complication (HCC) (Chronic)   Currently medications are MTF.  No hypoglycemic episodes noted. Home blood sugars in the 130 range. Last visit medical regimen changes were none. Lab Results  Component Value Date   HGBA1C 6.9 (A) 11/25/2023          Relevant Medications   lisinopril  (ZESTRIL ) 10 MG tablet   metFORMIN  (GLUCOPHAGE ) 500 MG tablet   Other Relevant Orders   Comprehensive metabolic panel with GFR   Hemoglobin A1c   Microalbumin / creatinine urine ratio   Stress incontinence of urine   Well controlled on Detrol  2 mg bid      Relevant Medications   tolterodine  (DETROL ) 2 MG tablet   Statin myopathy   Other Visit Diagnoses       Long term current use of oral hypoglycemic drug           Return in about 4 months (around 08/04/2024) for DM, HTN  Dr. Lemon.    Leita HILARIO Adie, MD [facility] [place]

## 2024-04-08 ENCOUNTER — Ambulatory Visit: Payer: Self-pay | Admitting: Internal Medicine

## 2024-04-08 ENCOUNTER — Other Ambulatory Visit: Payer: Self-pay

## 2024-04-08 LAB — LIPID PANEL
Chol/HDL Ratio: 6 ratio — ABNORMAL HIGH (ref 0.0–4.4)
Cholesterol, Total: 265 mg/dL — ABNORMAL HIGH (ref 100–199)
HDL: 44 mg/dL (ref >39)
LDL Chol Calc (NIH): 188 mg/dL — ABNORMAL HIGH (ref 0–99)
Triglycerides: 174 mg/dL — ABNORMAL HIGH (ref 0–149)
VLDL Cholesterol Cal: 33 mg/dL (ref 5–40)

## 2024-04-08 LAB — CBC WITH DIFFERENTIAL/PLATELET
Basophils Absolute: 0 x10E3/uL (ref 0.0–0.2)
Basos: 0 %
EOS (ABSOLUTE): 0.1 x10E3/uL (ref 0.0–0.4)
Eos: 1 %
Hematocrit: 45.3 % (ref 34.0–46.6)
Hemoglobin: 14.1 g/dL (ref 11.1–15.9)
Immature Grans (Abs): 0 x10E3/uL (ref 0.0–0.1)
Immature Granulocytes: 0 %
Lymphocytes Absolute: 2.4 x10E3/uL (ref 0.7–3.1)
Lymphs: 27 %
MCH: 26.9 pg (ref 26.6–33.0)
MCHC: 31.1 g/dL — ABNORMAL LOW (ref 31.5–35.7)
MCV: 86 fL (ref 79–97)
Monocytes Absolute: 0.6 x10E3/uL (ref 0.1–0.9)
Monocytes: 7 %
Neutrophils Absolute: 5.7 x10E3/uL (ref 1.4–7.0)
Neutrophils: 65 %
Platelets: 282 x10E3/uL (ref 150–450)
RBC: 5.25 x10E6/uL (ref 3.77–5.28)
RDW: 14 % (ref 11.7–15.4)
WBC: 8.9 x10E3/uL (ref 3.4–10.8)

## 2024-04-08 LAB — COMPREHENSIVE METABOLIC PANEL WITH GFR
ALT: 28 IU/L (ref 0–32)
AST: 21 IU/L (ref 0–40)
Albumin: 4.5 g/dL (ref 3.8–4.8)
Alkaline Phosphatase: 107 IU/L (ref 49–135)
BUN/Creatinine Ratio: 24 (ref 12–28)
BUN: 23 mg/dL (ref 8–27)
Bilirubin Total: 0.5 mg/dL (ref 0.0–1.2)
CO2: 24 mmol/L (ref 20–29)
Calcium: 10 mg/dL (ref 8.7–10.3)
Chloride: 100 mmol/L (ref 96–106)
Creatinine, Ser: 0.96 mg/dL (ref 0.57–1.00)
Globulin, Total: 2.9 g/dL (ref 1.5–4.5)
Glucose: 111 mg/dL — ABNORMAL HIGH (ref 70–99)
Potassium: 4.8 mmol/L (ref 3.5–5.2)
Sodium: 139 mmol/L (ref 134–144)
Total Protein: 7.4 g/dL (ref 6.0–8.5)
eGFR: 63 mL/min/1.73 (ref >59)

## 2024-04-08 LAB — HEMOGLOBIN A1C
Est. average glucose Bld gHb Est-mCnc: 151 mg/dL
Hgb A1c MFr Bld: 6.9 % — ABNORMAL HIGH (ref 4.8–5.6)

## 2024-04-08 LAB — TSH: TSH: 2.29 u[IU]/mL (ref 0.450–4.500)

## 2024-04-08 LAB — MICROALBUMIN / CREATININE URINE RATIO
Creatinine, Urine: 90.7 mg/dL
Microalb/Creat Ratio: 4 mg/g{creat} (ref 0–29)
Microalbumin, Urine: 3.9 ug/mL

## 2024-04-08 MED ORDER — PRAVASTATIN SODIUM 10 MG PO TABS: 10.0000 mg | ORAL_TABLET | ORAL | 0 refills | Status: AC

## 2024-04-10 ENCOUNTER — Other Ambulatory Visit: Payer: Self-pay | Admitting: Internal Medicine

## 2024-04-10 DIAGNOSIS — N393 Stress incontinence (female) (male): Secondary | ICD-10-CM

## 2024-04-12 NOTE — Telephone Encounter (Signed)
 Change in pharmacy- will forward Rx. Requested Prescriptions  Pending Prescriptions Disp Refills   tolterodine  (DETROL ) 2 MG tablet [Pharmacy Med Name: TOLTERODINE   TAB 2MG ] 180 tablet 3    Sig: TAKE 1 TABLET TWICE A DAY     Urology:  Bladder Agents 2 Passed - 04/12/2024  1:30 PM      Passed - Cr in normal range and within 360 days    Creatinine, Ser  Date Value Ref Range Status  04/06/2024 0.96 0.57 - 1.00 mg/dL Final         Passed - ALT in normal range and within 360 days    ALT  Date Value Ref Range Status  04/06/2024 28 0 - 32 IU/L Final         Passed - AST in normal range and within 360 days    AST  Date Value Ref Range Status  04/06/2024 21 0 - 40 IU/L Final         Passed - Valid encounter within last 12 months    Recent Outpatient Visits           6 days ago Essential hypertension   [place] [facility] at [facility], Leita DEL, MD   2 weeks ago Flank pain, left side   [facility] at [facility], Leita DEL, MD   4 months ago Type II diabetes mellitus with complication [facility])   [place] [facility] at [place], Leita DEL, MD   8 months ago Essential hypertension   [facility] at [facility], Leita DEL, MD

## 2024-04-16 ENCOUNTER — Ambulatory Visit

## 2024-04-17 ENCOUNTER — Ambulatory Visit (INDEPENDENT_AMBULATORY_CARE_PROVIDER_SITE_OTHER)

## 2024-04-17 ENCOUNTER — Ambulatory Visit: Payer: Self-pay | Admitting: Nurse Practitioner

## 2024-04-17 ENCOUNTER — Ambulatory Visit
Admission: RE | Admit: 2024-04-17 | Discharge: 2024-04-17 | Disposition: A | Discharge status: Home / Self Care | Source: Ambulatory Visit | Attending: Family Medicine | Admitting: Family Medicine

## 2024-04-17 VITALS — BP 104/58 | HR 102 | Temp 97.9°F

## 2024-04-17 DIAGNOSIS — R059 Cough, unspecified: Secondary | ICD-10-CM | POA: Diagnosis not present

## 2024-04-17 DIAGNOSIS — R1011 Right upper quadrant pain: Secondary | ICD-10-CM | POA: Diagnosis not present

## 2024-04-17 DIAGNOSIS — K8050 Calculus of bile duct without cholangitis or cholecystitis without obstruction: Secondary | ICD-10-CM | POA: Diagnosis not present

## 2024-04-17 DIAGNOSIS — E119 Type 2 diabetes mellitus without complications: Secondary | ICD-10-CM | POA: Diagnosis present

## 2024-04-17 DIAGNOSIS — Z7984 Long term (current) use of oral hypoglycemic drugs: Secondary | ICD-10-CM | POA: Diagnosis not present

## 2024-04-17 DIAGNOSIS — K5730 Diverticulosis of large intestine without perforation or abscess without bleeding: Secondary | ICD-10-CM | POA: Diagnosis present

## 2024-04-17 DIAGNOSIS — B9689 Other specified bacterial agents as the cause of diseases classified elsewhere: Secondary | ICD-10-CM | POA: Diagnosis not present

## 2024-04-17 DIAGNOSIS — R32 Unspecified urinary incontinence: Secondary | ICD-10-CM | POA: Diagnosis present

## 2024-04-17 DIAGNOSIS — B961 Klebsiella pneumoniae [K. pneumoniae] as the cause of diseases classified elsewhere: Secondary | ICD-10-CM | POA: Diagnosis present

## 2024-04-17 DIAGNOSIS — J111 Influenza due to unidentified influenza virus with other respiratory manifestations: Secondary | ICD-10-CM | POA: Diagnosis not present

## 2024-04-17 DIAGNOSIS — R051 Acute cough: Secondary | ICD-10-CM | POA: Diagnosis not present

## 2024-04-17 DIAGNOSIS — K8070 Calculus of gallbladder and bile duct without cholecystitis without obstruction: Secondary | ICD-10-CM | POA: Diagnosis not present

## 2024-04-17 DIAGNOSIS — Z860100 Personal history of colon polyps, unspecified: Secondary | ICD-10-CM | POA: Diagnosis not present

## 2024-04-17 DIAGNOSIS — K838 Other specified diseases of biliary tract: Secondary | ICD-10-CM | POA: Diagnosis present

## 2024-04-17 DIAGNOSIS — R935 Abnormal findings on diagnostic imaging of other abdominal regions, including retroperitoneum: Secondary | ICD-10-CM | POA: Diagnosis not present

## 2024-04-17 DIAGNOSIS — J101 Influenza due to other identified influenza virus with other respiratory manifestations: Secondary | ICD-10-CM | POA: Diagnosis present

## 2024-04-17 DIAGNOSIS — E860 Dehydration: Secondary | ICD-10-CM | POA: Diagnosis present

## 2024-04-17 DIAGNOSIS — R7889 Finding of other specified substances, not normally found in blood: Secondary | ICD-10-CM | POA: Diagnosis not present

## 2024-04-17 DIAGNOSIS — K8042 Calculus of bile duct with acute cholecystitis without obstruction: Secondary | ICD-10-CM | POA: Diagnosis not present

## 2024-04-17 DIAGNOSIS — R7881 Bacteremia: Secondary | ICD-10-CM | POA: Diagnosis present

## 2024-04-17 DIAGNOSIS — R7989 Other specified abnormal findings of blood chemistry: Secondary | ICD-10-CM | POA: Diagnosis present

## 2024-04-17 DIAGNOSIS — R000 Tachycardia, unspecified: Secondary | ICD-10-CM | POA: Diagnosis not present

## 2024-04-17 DIAGNOSIS — K8071 Calculus of gallbladder and bile duct without cholecystitis with obstruction: Secondary | ICD-10-CM | POA: Diagnosis not present

## 2024-04-17 DIAGNOSIS — E871 Hypo-osmolality and hyponatremia: Secondary | ICD-10-CM | POA: Diagnosis present

## 2024-04-17 DIAGNOSIS — K8001 Calculus of gallbladder with acute cholecystitis with obstruction: Secondary | ICD-10-CM | POA: Diagnosis present

## 2024-04-17 DIAGNOSIS — R112 Nausea with vomiting, unspecified: Secondary | ICD-10-CM | POA: Diagnosis not present

## 2024-04-17 DIAGNOSIS — I10 Essential (primary) hypertension: Secondary | ICD-10-CM | POA: Diagnosis present

## 2024-04-17 DIAGNOSIS — K8043 Calculus of bile duct with acute cholecystitis with obstruction: Secondary | ICD-10-CM | POA: Diagnosis present

## 2024-04-17 DIAGNOSIS — E785 Hyperlipidemia, unspecified: Secondary | ICD-10-CM | POA: Diagnosis present

## 2024-04-17 DIAGNOSIS — K449 Diaphragmatic hernia without obstruction or gangrene: Secondary | ICD-10-CM | POA: Diagnosis present

## 2024-04-17 DIAGNOSIS — R0602 Shortness of breath: Secondary | ICD-10-CM

## 2024-04-17 DIAGNOSIS — Z1152 Encounter for screening for COVID-19: Secondary | ICD-10-CM | POA: Diagnosis not present

## 2024-04-17 DIAGNOSIS — K828 Other specified diseases of gallbladder: Secondary | ICD-10-CM | POA: Diagnosis present

## 2024-04-17 DIAGNOSIS — Z79899 Other long term (current) drug therapy: Secondary | ICD-10-CM | POA: Diagnosis not present

## 2024-04-17 DIAGNOSIS — K219 Gastro-esophageal reflux disease without esophagitis: Secondary | ICD-10-CM | POA: Diagnosis present

## 2024-04-17 DIAGNOSIS — K8021 Calculus of gallbladder without cholecystitis with obstruction: Secondary | ICD-10-CM | POA: Diagnosis not present

## 2024-04-17 LAB — POC COVID19/FLU A&B COMBO
Covid Antigen, POC: NEGATIVE
Influenza A Antigen, POC: NEGATIVE
Influenza B Antigen, POC: NEGATIVE

## 2024-04-17 MED ORDER — IPRATROPIUM-ALBUTEROL 0.5-2.5 (3) MG/3ML IN SOLN
3.0000 mL | Freq: Once | RESPIRATORY_TRACT | Status: AC
  Administered 2024-04-17: 3 mL via RESPIRATORY_TRACT
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
## 2024-04-17 NOTE — Discharge Instructions (Addendum)
 Please go to the ER for further evaluation of your symptoms

## 2024-04-17 NOTE — ED Notes (Signed)
 Patient is being discharged from the Urgent Care and sent to the Emergency Department via private vehicle . Per Myla Bold, NP, patient is in need of higher level of care due to SOB. Patient is aware and verbalizes understanding of plan of care.  Vitals:   04/17/24 1352  BP: (!) 104/58  Pulse: (!) 102  Temp: 97.9 F (36.6 C)  SpO2: 97%

## 2024-04-17 NOTE — ED Triage Notes (Signed)
 Pt was around her husband who had the flu  Pt c/o body aches, chills, cough, SOB, congestion x1week

## 2024-04-21 ENCOUNTER — Telehealth: Payer: Self-pay

## 2024-04-21 NOTE — Telephone Encounter (Signed)
Sent pt message on Mychart.  KP

## 2024-04-21 NOTE — Telephone Encounter (Signed)
 FYI  KP

## 2024-04-21 NOTE — Telephone Encounter (Signed)
 Copied from CRM [id number]. Topic: General - Other >> Apr 21, 2024 10:24 AM Pamela Rosario wrote: Reason for CRM: Patient just wanted to let PCP know that she was admitted in the hospital 11/15 - will be discharged today but unsure if she needs to follow up with primary care after she speaks with clinic staff.

## 2024-04-22 ENCOUNTER — Telehealth: Payer: Self-pay | Admitting: *Deleted

## 2024-04-22 NOTE — Transitions of Care (Post Inpatient/ED Visit) (Signed)
   04/22/2024  Name: Pamela Rosario MRN: [id number] DOB: [birth year]  Today's TOC FU Call Status: Today's TOC FU Call Status:: Unsuccessful Call (1st Attempt) Unsuccessful Call (1st Attempt) Date: 04/22/24  Attempted to reach the patient regarding the most recent Inpatient visit.  Left HIPAA compliant voice message   Follow Up Plan: Additional outreach attempts will be made to reach the patient to complete the Transitions of Care (Post Inpatient/ED visit) call.   Pls call/ message for questions,  Telesia Ates Mckinney Kameshia Madruga, RN, BSN, CCRN Alumnus RN Care Manager  Transitions of Care  [facility] - [facility] [phone]: direct office

## 2024-04-23 ENCOUNTER — Telehealth: Payer: Self-pay | Admitting: *Deleted

## 2024-04-23 NOTE — Transitions of Care (Post Inpatient/ED Visit) (Signed)
 04/23/2024  Name: Pamela Rosario MRN: [id number] DOB: Jan 30, 1951  Today's TOC FU Call Status: Today's TOC FU Call Status:: Successful TOC FU Call Completed TOC FU Call Complete Date: 04/23/24  Patient's Name and Date of Birth confirmed. Name, DOB  Transition Care Management Follow-up Telephone Call Date of Discharge: 04/21/24 Discharge Facility: Other [facility]) Name of Other ([facility]) Discharge Facility: [facility] Type of Discharge: Inpatient Admission Primary Inpatient Discharge Diagnosis:: Cholecystitis with obstruction due to gallstones; ERCP; bacteremia How have you been since you were released from the hospital?: Better (I am fine now that they got rid of the gallstones- back to my normal self and not having any problems.  I really don't want to review these medicines with you- don't have time.  Don't need any follow up calls, have the surgeons number if concerns arise) Any questions or concerns?: No  Items Reviewed: Did you receive and understand the discharge instructions provided?: Yes (briefly reviewed with patient who verbalizes good understanding of same - outside hospital AVS) Medications obtained,verified, and reconciled?: No (Declined medication review; confirmed per patient report obtained/ is taking all newly Rx'd medications as instructed; self-manages medications and denies questions/ concerns around medications today) Medications Not Reviewed Reasons:: Other: (Patient declined medication review- not near medications; confirms no questions or concerns around meds) Any new allergies since your discharge?: No Dietary orders reviewed?: Yes Type of Diet Ordered:: Low fat; soft Do you have support at home?: Yes People in Home [RPT]: spouse Name of Support/Comfort Primary Source: Reports independent in self-care activities; resides with supportive spouse- assists as/ if needed/ indicated; reports local adult son also assists as indicated  Medications Reviewed  Today: Medications Reviewed Today     Reviewed by Evian Derringer M, RN (Registered Nurse) on 04/23/24 at 1232  Med List Status: <None>   Medication Order Taking? Sig Documenting Provider Last Dose Status Informant  Blood Glucose Monitoring Suppl DEVI [id number] No 1 each by Does not apply route in the morning, at noon, and at bedtime. May substitute to any manufacturer covered by patient's insurance. Justus Leita DEL, MD 04/17/2024 Active   glucose blood (ACCU-CHEK GUIDE TEST) test strip [id number] No USE 1 STRIP DAILY FOR BLOOD SUGAR Justus Leita DEL, MD 04/17/2024 Active   glucose blood test strip [id number] No 1 each by Other route as needed for other. Use as instructed [provider] 04/17/2024 Active            Med Note VASHTI SAVER D   Mon Mar 06, 2020  8:30 AM) [place] - pt prefers to buy OTC  levocetirizine (XYZAL) 5 MG tablet [id number] No Take 5 mg by mouth every evening. PRN [provider] 04/17/2024 Active   lisinopril  (ZESTRIL ) 10 MG tablet [id number] No Take 1 tablet (10 mg total) by mouth daily. Justus Leita DEL, MD 04/17/2024 Active   metFORMIN  (GLUCOPHAGE ) 500 MG tablet [id number] No TAKE 1/2 TABLET DAILY Berglund, Laura H, MD 04/17/2024 Active   Naproxen Sodium (ALEVE PO) [id number]  Take by mouth as needed. [provider]  Active   omeprazole  (PRILOSEC) 20 MG capsule [id number] No Take 1 capsule (20 mg total) by mouth daily. Justus Leita DEL, MD 04/17/2024 Active   pravastatin  (PRAVACHOL ) 10 MG tablet [id number] No Take 1 tablet (10 mg total) by mouth every other day. Justus Leita DEL, MD 04/17/2024 Active   tolterodine  (DETROL ) 2 MG tablet [id number] No TAKE 1 TABLET TWICE A DAY Justus Leita DEL, MD 04/17/2024 Active  Home Care and Equipment/Supplies: Were Home Health Services Ordered?: No Any new equipment or medical supplies ordered?: No  Functional Questionnaire: Do you need assistance with bathing/showering or dressing?: No Do  you need assistance with meal preparation?: No Do you need assistance with eating?: No Do you have difficulty maintaining continence: No Do you need assistance with getting out of bed/getting out of a chair/moving?: No Do you have difficulty managing or taking your medications?: No  Follow up appointments reviewed: PCP Follow-up appointment confirmed?: No (Declined offer of care coordination outreach in real-time with scheduling care guide to schedule hospital follow up PCP appointment: Dr. Justus told me herself yesterday that I don't need to follow up with her- only the surgeon) MD Provider Line [phone] Given: No (verified well-established with current PCP) Specialist Hospital Follow-up appointment confirmed?: Yes Date of Specialist follow-up appointment?: 05/07/24 Follow-Up Specialty Provider:: Surgical provider at [facility] Do you need transportation to your follow-up appointment?: No Do you understand care options if your condition(s) worsen?: Yes-patient verbalized understanding  SDOH Interventions Today    Flowsheet Row Most Recent Value  SDOH Interventions   Food Insecurity Interventions Intervention Not Indicated  Housing Interventions Intervention Not Indicated  Transportation Interventions Intervention Not Indicated  [drives self,  family assists as indicated]  Utilities Interventions Intervention Not Indicated   See TOC assessment tabs for additional assessment/ TOC intervention information Reinforced signs/ symptoms wound infection along with corresponding action plan Reinforced post- surgical dietary recommendations   Patient declines need for ongoing/ further care management/ coordination outreach; declines enrollment in 30-day TOC program- declines taking my direct phone number should needs/ concerns arise post-TOC call   Pls call/ message for questions,  Danne Vasek Mckinney Takima Encina, RN, BSN, Media Planner  Transitions of Care  [facility] - [facility] [phone]: direct office

## 2024-05-04 ENCOUNTER — Encounter: Payer: Self-pay | Admitting: Internal Medicine

## 2024-05-04 ENCOUNTER — Ambulatory Visit: Admitting: Internal Medicine

## 2024-05-04 ENCOUNTER — Other Ambulatory Visit (HOSPITAL_COMMUNITY)
Admission: RE | Admit: 2024-05-04 | Discharge: 2024-05-04 | Disposition: A | Source: Ambulatory Visit | Attending: Internal Medicine | Admitting: Internal Medicine

## 2024-05-04 VITALS — BP 122/70 | HR 74 | Ht 63.0 in | Wt 158.0 lb

## 2024-05-04 DIAGNOSIS — N760 Acute vaginitis: Secondary | ICD-10-CM | POA: Insufficient documentation

## 2024-05-04 LAB — POCT URINALYSIS DIPSTICK
Bilirubin, UA: NEGATIVE
Glucose, UA: NEGATIVE
Ketones, UA: NEGATIVE
Nitrite, UA: POSITIVE — AB
Protein, UA: NEGATIVE
Spec Grav, UA: 1.015 (ref 1.010–1.025)
Urobilinogen, UA: 0.2 U/dL
pH, UA: 5 (ref 5.0–8.0)

## 2024-05-04 MED ORDER — FLUCONAZOLE 100 MG PO TABS: 100.0000 mg | ORAL_TABLET | Freq: Every day | ORAL | 0 refills | Status: AC

## 2024-05-04 NOTE — Addendum Note (Signed)
 Addended by: CLAUDIS EUAL SAILOR on: 05/04/2024 10:25 AM   Modules accepted: Orders

## 2024-05-04 NOTE — Progress Notes (Signed)
 Date:  05/04/2024   Name:  Pamela Rosario Date   DOB:  23-Nov-1950   MRN:  [id number]   Chief Complaint: Vaginal Itching (Itching, burning when urinating. )  Vaginal Itching The patient's primary symptoms include genital itching. The patient's pertinent negatives include no pelvic pain. This is a new problem. The current episode started in the past 7 days. The problem occurs constantly. The problem has been unchanged. The patient is experiencing no pain. Associated symptoms include dysuria. Pertinent negatives include no abdominal pain, chills, fever, nausea or vomiting. Her past medical history is significant for an abdominal surgery.  Lap chole about 2 weeks ago, treated with IV Zosyn for positive blood culture and sent home on oral Cipro which she completed about a week ago.  Symptoms started about the same time - burning externally with urination and at other times as well, itching but no discharge or bleeding.  Review of Systems  Constitutional:  Negative for chills, fatigue and fever.  Respiratory:  Negative for chest tightness and shortness of breath.   Cardiovascular:  Negative for chest pain and palpitations.  Gastrointestinal:  Negative for abdominal pain, nausea and vomiting.  Genitourinary:  Positive for dysuria. Negative for pelvic pain.  Musculoskeletal:  Negative for arthralgias.  Psychiatric/Behavioral:  Negative for sleep disturbance.      Lab Results  Component Value Date   NA 139 04/06/2024   K 4.8 04/06/2024   CO2 24 04/06/2024   GLUCOSE 111 (H) 04/06/2024   BUN 23 04/06/2024   CREATININE 0.96 04/06/2024   CALCIUM  10.0 04/06/2024   EGFR 63 04/06/2024   GFRNONAA 67 03/06/2020   Lab Results  Component Value Date   CHOL 265 (H) 04/06/2024   HDL 44 04/06/2024   LDLCALC 188 (H) 04/06/2024   TRIG 174 (H) 04/06/2024   CHOLHDL 6.0 (H) 04/06/2024   Lab Results  Component Value Date   TSH 2.290 04/06/2024   Lab Results  Component Value Date   HGBA1C 6.9 (H)  04/06/2024   Lab Results  Component Value Date   WBC 8.9 04/06/2024   HGB 14.1 04/06/2024   HCT 45.3 04/06/2024   MCV 86 04/06/2024   PLT 282 04/06/2024   Lab Results  Component Value Date   ALT 28 04/06/2024   AST 21 04/06/2024   ALKPHOS 107 04/06/2024   BILITOT 0.5 04/06/2024   No results found for: MARIEN BOLLS, VD25OH   Patient Active Problem List   Diagnosis Date Noted   Statin myopathy 04/06/2024   History of colonic polyps    Polyp of sigmoid colon    Stress incontinence of urine 02/25/2017   Hiatal hernia with GERD without esophagitis 09/06/2016   Fibrocystic breast changes of both breasts 09/06/2016   Intraductal papilloma of breast, left 09/06/2016   Hyperlipidemia associated with type 2 diabetes mellitus (HCC) 09/06/2016   Essential hypertension 09/06/2016   Type II diabetes mellitus with complication (HCC) 09/06/2016   Adenomatous colon polyp 09/06/2016    Allergies  Allergen Reactions   Demerol [Meperidine] Hives   Tetracyclines & Related Hives   Simvastatin Other (See Comments)    Muscle pain    Past Surgical History:  Procedure Laterality Date   brachial cleft cyst excison ( Right Neck )  1971   BREAST EXCISIONAL BIOPSY Left    intraductal papilloma   BREAST SURGERY  2007   L intraductal papilloma excision   CATARACT EXTRACTION W/PHACO Right 06/17/2023   Procedure: CATARACT EXTRACTION PHACO AND INTRAOCULAR LENS  PLACEMENT (IOC) RIGHT DIABETIC 8.35 00:55.2;  Surgeon: Jaye Fallow, MD;  Location: [facility];  Service: Ophthalmology;  Laterality: Right;   CATARACT EXTRACTION W/PHACO Left 07/01/2023   Procedure: CATARACT EXTRACTION PHACO AND INTRAOCULAR LENS PLACEMENT (IOC) LEFT DIABETIC 5.26 00:37.3;  Surgeon: Jaye Fallow, MD;  Location: [facility];  Service: Ophthalmology;  Laterality: Left;   CESAREAN SECTION  1990   emergency   COLONOSCOPY  08/24/2013   tubular adenoma   COLONOSCOPY N/A 02/25/2024    Procedure: COLONOSCOPY;  Surgeon: Marinda Jayson KIDD, MD;  Location: [facility] END[facility]COPY;  Service: General;  Laterality: N/A;   COLONOSCOPY WITH PROPOFOL  N/A 02/05/2019   Procedure: COLONOSCOPY WITH BIOPSIES;  Surgeon: Jinny Carmine, MD;  Location: [facility];  Service: Endoscopy;  Laterality: N/A;  Diabetic - oral meds   ESOPHAGOGASTRODUODENOSCOPY  2005   hiatal hernia & polyp found   EXCISION / BIOPSY BREAST / NIPPLE / DUCT Left 2005   EYE SURGERY Bilateral 06/18/2023   Cataracts   LARYNGOSCOPY  2006   left intraductal papilloma excision of left breast  2007   POLYPECTOMY N/A 02/05/2019   Procedure: POLYPECTOMY;  Surgeon: Jinny Carmine, MD;  Location: [facility];  Service: Endoscopy;  Laterality: N/A;   POLYPECTOMY  02/25/2024   Procedure: POLYPECTOMY, INTESTINE;  Surgeon: Marinda Jayson KIDD, MD;  Location: [facility] ENDOSCOPY;  Service: General;;   TONSILECTOMY, ADENOIDECTOMY, BILATERAL MYRINGOTOMY AND TUBES      Social History   Tobacco Use   Smoking status: Never   Smokeless tobacco: Never   Tobacco comments:    smoking cessation materials not required  Vaping Use   Vaping status: Never Used  Substance Use Topics   Alcohol use: No   Drug use: No     Medication list has been reviewed and updated.  Current Meds  Medication Sig   Blood Glucose Monitoring Suppl DEVI 1 each by Does not apply route in the morning, at noon, and at bedtime. May substitute to any manufacturer covered by patient's insurance.   fluconazole (DIFLUCAN) 100 MG tablet Take 1 tablet (100 mg total) by mouth daily for 3 days.   glucose blood (ACCU-CHEK GUIDE TEST) test strip USE 1 STRIP DAILY FOR BLOOD SUGAR   glucose blood test strip 1 each by Other route as needed for other. Use as instructed   levocetirizine (XYZAL) 5 MG tablet Take 5 mg by mouth every evening. PRN   lisinopril  (ZESTRIL ) 10 MG tablet Take 1 tablet (10 mg total) by mouth daily.   metFORMIN  (GLUCOPHAGE ) 500 MG tablet TAKE 1/2 TABLET  DAILY   Naproxen Sodium (ALEVE PO) Take by mouth as needed.   omeprazole  (PRILOSEC) 20 MG capsule Take 1 capsule (20 mg total) by mouth daily.   pravastatin  (PRAVACHOL ) 10 MG tablet Take 1 tablet (10 mg total) by mouth every other day.   tolterodine  (DETROL ) 2 MG tablet TAKE 1 TABLET TWICE A DAY       05/04/2024    9:24 AM 04/06/2024    7:51 AM 03/24/2024    2:25 PM 11/25/2023    9:48 AM  GAD 7 : Generalized Anxiety Score  Nervous, Anxious, on Edge 0 0 0 0  Control/stop worrying 0 0 0 0  Worry too much - different things 0 0 0 0  Trouble relaxing 0 0 0 0  Restless 0 0 0 0  Easily annoyed or irritable 0 0 0 0  Afraid - awful might happen 0 0 0 0  Total GAD  7 Score 0 0 0 0  Anxiety Difficulty Not difficult at all Not difficult at all Not difficult at all Not difficult at all       05/04/2024    9:24 AM 04/23/2024   12:15 PM 04/06/2024    7:51 AM  Depression screen PHQ 2/9  Decreased Interest 0 0 0  Down, Depressed, Hopeless 0 0 0  PHQ - 2 Score 0 0 0  Altered sleeping 0  0  Tired, decreased energy 0  0  Change in appetite 0  0  Feeling bad or failure about yourself  0  0  Trouble concentrating 0  0  Moving slowly or fidgety/restless 0  0  Suicidal thoughts 0  0  PHQ-9 Score 0  0   Difficult doing work/chores Not difficult at all  Not difficult at all     Data saved with a previous flowsheet row definition    BP Readings from Last 3 Encounters:  05/04/24 122/70  04/17/24 (!) 104/58  04/06/24 112/64    Physical Exam Vitals and nursing note reviewed.  Constitutional:      General: She is not in acute distress.    Appearance: Normal appearance. She is well-developed.  HENT:     Head: Normocephalic and atraumatic.  Cardiovascular:     Rate and Rhythm: Regular rhythm.  Pulmonary:     Effort: Pulmonary effort is normal. No respiratory distress.     Breath sounds: No wheezing or rhonchi.  Abdominal:     Palpations: Abdomen is soft.     Tenderness: There is  abdominal tenderness (mild suprapubic).      Comments: Healing lap chole scars  Musculoskeletal:     Cervical back: Normal range of motion.  Lymphadenopathy:     Cervical: No cervical adenopathy.  Skin:    General: Skin is warm and dry.     Findings: No rash.  Neurological:     General: No focal deficit present.     Mental Status: She is alert and oriented to person, place, and time.  Psychiatric:        Mood and Affect: Mood normal.        Behavior: Behavior normal.    Lab Results  Component Value Date   COLORU yellow 05/04/2024   CLARITYU cloudy 05/04/2024   GLUCOSEUR Negative 05/04/2024   BILIRUBINUR neg 05/04/2024   KETONESU neg 05/04/2024   SPECGRAV 1.015 05/04/2024   RBCUR smal 1+ (A) 05/04/2024   PHUR 5.0 05/04/2024   PROTEINUR Negative 05/04/2024   UROBILINOGEN 0.2 05/04/2024   LEUKOCYTESUR Large (3+) (A) 05/04/2024     Wt Readings from Last 3 Encounters:  05/04/24 158 lb (71.7 kg)  04/06/24 168 lb 12.8 oz (76.6 kg)  03/31/24 168 lb 12.8 oz (76.6 kg)    BP 122/70   Pulse 74   Ht 5' 3 (1.6 m)   Wt 158 lb (71.7 kg)   SpO2 98%   BMI 27.99 kg/m   Assessment and Plan:  Problem List Items Addressed This Visit   None Visit Diagnoses       Acute vaginitis    -  Primary   suspect this as the cause of her symptoms will get Aptima swab and treat presumptively; adjust treatment per test results   Relevant Medications   fluconazole (DIFLUCAN) 100 MG tablet   Other Relevant Orders   Cervicovaginal ancillary only   POCT urinalysis dipstick (Completed)       No follow-ups on file.  Leita HILARIO Adie, MD [facility] [place]

## 2024-05-05 ENCOUNTER — Ambulatory Visit: Payer: Self-pay | Admitting: Internal Medicine

## 2024-05-05 LAB — CERVICOVAGINAL ANCILLARY ONLY
Bacterial Vaginitis (gardnerella): NEGATIVE
Candida Glabrata: NEGATIVE
Candida Vaginitis: NEGATIVE
Chlamydia: NEGATIVE
Comment: NEGATIVE
Comment: NEGATIVE
Comment: NEGATIVE
Comment: NEGATIVE
Comment: NEGATIVE
Comment: NORMAL
Neisseria Gonorrhea: NEGATIVE
Trichomonas: NEGATIVE

## 2024-05-06 ENCOUNTER — Telehealth: Payer: Self-pay

## 2024-05-06 ENCOUNTER — Other Ambulatory Visit: Payer: Self-pay | Admitting: Internal Medicine

## 2024-05-06 DIAGNOSIS — N3000 Acute cystitis without hematuria: Secondary | ICD-10-CM

## 2024-05-06 MED ORDER — NITROFURANTOIN MONOHYD MACRO 100 MG PO CAPS: 100.0000 mg | ORAL_CAPSULE | Freq: Two times a day (BID) | ORAL | 0 refills | Status: AC

## 2024-05-06 NOTE — Telephone Encounter (Signed)
 Copied from CRM [id number]. Topic: Clinical - Prescription Issue >> May 06, 2024  8:08 AM Avram MATSU wrote: Reason for CRM: patient stated if she can have her rx sent to the pharmacy, she spoke with her provider via mychart. Please advise [phone]  [id number] [place], [place] - [address] [place] [address] Phone: [phone] Fax: [phone]

## 2024-05-06 NOTE — Telephone Encounter (Signed)
 Medication sent today per Dr Justus.

## 2024-05-06 NOTE — Telephone Encounter (Signed)
 Please send RX for patient. Thanks

## 2024-05-06 NOTE — Progress Notes (Unsigned)
 Date:  05/06/2024   Name:  Pamela Rosario   DOB:  06-14-1950   MRN:  [id number]   Chief Complaint: No chief complaint on file.  HPI  Review of Systems   Lab Results  Component Value Date   NA 139 04/06/2024   K 4.8 04/06/2024   CO2 24 04/06/2024   GLUCOSE 111 (H) 04/06/2024   BUN 23 04/06/2024   CREATININE 0.96 04/06/2024   CALCIUM  10.0 04/06/2024   EGFR 63 04/06/2024   GFRNONAA 67 03/06/2020   Lab Results  Component Value Date   CHOL 265 (H) 04/06/2024   HDL 44 04/06/2024   LDLCALC 188 (H) 04/06/2024   TRIG 174 (H) 04/06/2024   CHOLHDL 6.0 (H) 04/06/2024   Lab Results  Component Value Date   TSH 2.290 04/06/2024   Lab Results  Component Value Date   HGBA1C 6.9 (H) 04/06/2024   Lab Results  Component Value Date   WBC 8.9 04/06/2024   HGB 14.1 04/06/2024   HCT 45.3 04/06/2024   MCV 86 04/06/2024   PLT 282 04/06/2024   Lab Results  Component Value Date   ALT 28 04/06/2024   AST 21 04/06/2024   ALKPHOS 107 04/06/2024   BILITOT 0.5 04/06/2024   No results found for: MARIEN BOLLS, VD25OH   Patient Active Problem List   Diagnosis Date Noted   Statin myopathy 04/06/2024   History of colonic polyps    Polyp of sigmoid colon    Stress incontinence of urine 02/25/2017   Hiatal hernia with GERD without esophagitis 09/06/2016   Fibrocystic breast changes of both breasts 09/06/2016   Intraductal papilloma of breast, left 09/06/2016   Hyperlipidemia associated with type 2 diabetes mellitus (HCC) 09/06/2016   Essential hypertension 09/06/2016   Type II diabetes mellitus with complication (HCC) 09/06/2016   Adenomatous colon polyp 09/06/2016    Allergies  Allergen Reactions   Demerol [Meperidine] Hives   Tetracyclines & Related Hives   Simvastatin Other (See Comments)    Muscle pain    Past Surgical History:  Procedure Laterality Date   brachial cleft cyst excison ( Right Neck )  1971   BREAST EXCISIONAL BIOPSY Left    intraductal  papilloma   BREAST SURGERY  2007   L intraductal papilloma excision   CATARACT EXTRACTION W/PHACO Right 06/17/2023   Procedure: CATARACT EXTRACTION PHACO AND INTRAOCULAR LENS PLACEMENT (IOC) RIGHT DIABETIC 8.35 00:55.2;  Surgeon: Jaye Fallow, MD;  Location: [facility];  Service: Ophthalmology;  Laterality: Right;   CATARACT EXTRACTION W/PHACO Left 07/01/2023   Procedure: CATARACT EXTRACTION PHACO AND INTRAOCULAR LENS PLACEMENT (IOC) LEFT DIABETIC 5.26 00:37.3;  Surgeon: Jaye Fallow, MD;  Location: [facility];  Service: Ophthalmology;  Laterality: Left;   CESAREAN SECTION  1990   emergency   COLONOSCOPY  08/24/2013   tubular adenoma   COLONOSCOPY N/A 02/25/2024   Procedure: COLONOSCOPY;  Surgeon: Marinda Jayson KIDD, MD;  Location: [facility] END[facility]COPY;  Service: General;  Laterality: N/A;   COLONOSCOPY WITH PROPOFOL  N/A 02/05/2019   Procedure: COLONOSCOPY WITH BIOPSIES;  Surgeon: Jinny Carmine, MD;  Location: [facility];  Service: Endoscopy;  Laterality: N/A;  Diabetic - oral meds   ESOPHAGOGASTRODUODENOSCOPY  2005   hiatal hernia & polyp found   EXCISION / BIOPSY BREAST / NIPPLE / DUCT Left 2005   EYE SURGERY Bilateral 06/18/2023   Cataracts   LARYNGOSCOPY  2006   left intraductal papilloma excision of left breast  2007   POLYPECTOMY N/A  02/05/2019   Procedure: POLYPECTOMY;  Surgeon: Jinny Carmine, MD;  Location[facility];  Service: Endoscopy;  Laterality: N/A;   POLYPECTOMY  02/25/2024   Procedure: POLYPECTOMY, INTESTINE;  Surgeon: Marinda Jayson KIDD, MD;  Location: [facility] ENDOSCOPY;  Service: General;;   TONSILECTOMY, ADENOIDECTOMY, BILATERAL MYRINGOTOMY AND TUBES      Social History   Tobacco Use   Smoking status: Never   Smokeless tobacco: Never   Tobacco comments:    smoking cessation materials not required  Vaping Use   Vaping status: Never Used  Substance Use Topics   Alcohol use: No   Drug use: No     Medication list has been  reviewed and updated.  No outpatient medications have been marked as taking for the 05/06/24 encounter (Orders Only) with Justus Leita DEL, MD.       05/04/2024    9:24 AM 04/06/2024    7:51 AM 03/24/2024    2:25 PM 11/25/2023    9:48 AM  GAD 7 : Generalized Anxiety Score  Nervous, Anxious, on Edge 0 0 0 0  Control/stop worrying 0 0 0 0  Worry too much - different things 0 0 0 0  Trouble relaxing 0 0 0 0  Restless 0 0 0 0  Easily annoyed or irritable 0 0 0 0  Afraid - awful might happen 0 0 0 0  Total GAD 7 Score 0 0 0 0  Anxiety Difficulty Not difficult at all Not difficult at all Not difficult at all Not difficult at all       05/04/2024    9:24 AM 04/23/2024   12:15 PM 04/06/2024    7:51 AM  Depression screen PHQ 2/9  Decreased Interest 0 0 0  Down, Depressed, Hopeless 0 0 0  PHQ - 2 Score 0 0 0  Altered sleeping 0  0  Tired, decreased energy 0  0  Change in appetite 0  0  Feeling bad or failure about yourself  0  0  Trouble concentrating 0  0  Moving slowly or fidgety/restless 0  0  Suicidal thoughts 0  0  PHQ-9 Score 0  0   Difficult doing work/chores Not difficult at all  Not difficult at all     Data saved with a previous flowsheet row definition    BP Readings from Last 3 Encounters:  05/04/24 122/70  04/17/24 (!) 104/58  04/06/24 112/64    Physical Exam  Wt Readings from Last 3 Encounters:  05/04/24 158 lb (71.7 kg)  04/06/24 168 lb 12.8 oz (76.6 kg)  03/31/24 168 lb 12.8 oz (76.6 kg)    There were no vitals taken for this visit.  Assessment and Plan:  Problem List Items Addressed This Visit   None   No follow-ups on file.    Leita HILARIO Justus, MD [facility] [place]

## 2024-05-07 DIAGNOSIS — Z9049 Acquired absence of other specified parts of digestive tract: Secondary | ICD-10-CM | POA: Diagnosis not present

## 2024-05-07 DIAGNOSIS — K449 Diaphragmatic hernia without obstruction or gangrene: Secondary | ICD-10-CM | POA: Diagnosis not present

## 2024-05-07 LAB — URINE CULTURE

## 2024-05-13 DIAGNOSIS — K449 Diaphragmatic hernia without obstruction or gangrene: Secondary | ICD-10-CM | POA: Diagnosis not present

## 2024-05-13 DIAGNOSIS — K219 Gastro-esophageal reflux disease without esophagitis: Secondary | ICD-10-CM | POA: Diagnosis not present

## 2024-05-25 ENCOUNTER — Other Ambulatory Visit: Payer: Self-pay | Admitting: Internal Medicine

## 2024-05-25 DIAGNOSIS — I10 Essential (primary) hypertension: Secondary | ICD-10-CM

## 2024-05-26 NOTE — Telephone Encounter (Signed)
 Too soon for refill, LRF 04/06/24 FOR 90 DAYS.  Requested Prescriptions  Pending Prescriptions Disp Refills   lisinopril  (ZESTRIL ) 10 MG tablet [Pharmacy Med Name: LISINOPRIL  TAB 10MG ] 90 tablet 3    Sig: TAKE 1 TABLET DAILY     Cardiovascular:  ACE Inhibitors Passed - 05/26/2024  2:45 PM      Passed - Cr in normal range and within 180 days    Creatinine, Ser  Date Value Ref Range Status  04/06/2024 0.96 0.57 - 1.00 mg/dL Final         Passed - K in normal range and within 180 days    Potassium  Date Value Ref Range Status  04/06/2024 4.8 3.5 - 5.2 mmol/L Final         Passed - Patient is not pregnant      Passed - Last BP in normal range    BP Readings from Last 1 Encounters:  05/04/24 122/70         Passed - Valid encounter within last 6 months    Recent Outpatient Visits           3 weeks ago Acute vaginitis   [place] [facility] at [facility], Leita DEL, MD   1 month ago Essential hypertension   [place] [facility] at [facility], Leita DEL, MD   2 months ago Flank pain, left side   [facility] at [facility], Leita DEL, MD   6 months ago Type II diabetes mellitus with complication [facility])   [place] [facility] at [facility], Leita DEL, MD   10 months ago Essential hypertension   [facility] at [facility], Leita DEL, MD

## 2024-06-07 ENCOUNTER — Other Ambulatory Visit: Payer: Self-pay | Admitting: Internal Medicine

## 2024-06-07 DIAGNOSIS — Z780 Asymptomatic menopausal state: Secondary | ICD-10-CM

## 2024-06-07 DIAGNOSIS — Z13820 Encounter for screening for osteoporosis: Secondary | ICD-10-CM

## 2024-06-09 ENCOUNTER — Other Ambulatory Visit: Payer: Self-pay

## 2024-06-09 DIAGNOSIS — E118 Type 2 diabetes mellitus with unspecified complications: Secondary | ICD-10-CM

## 2024-06-09 MED ORDER — METFORMIN HCL 500 MG PO TABS: ORAL_TABLET | ORAL | 1 refills | Status: AC

## 2024-07-06 ENCOUNTER — Ambulatory Visit
Admission: RE | Admit: 2024-07-06 | Discharge: 2024-07-06 | Disposition: A | Source: Ambulatory Visit | Attending: Physician Assistant | Admitting: Physician Assistant

## 2024-07-06 ENCOUNTER — Ambulatory Visit

## 2024-07-06 DIAGNOSIS — Z13820 Encounter for screening for osteoporosis: Secondary | ICD-10-CM

## 2024-07-06 DIAGNOSIS — Z780 Asymptomatic menopausal state: Secondary | ICD-10-CM

## 2024-07-07 ENCOUNTER — Ambulatory Visit: Payer: Self-pay | Admitting: Physician Assistant

## 2024-08-04 ENCOUNTER — Ambulatory Visit: Admitting: Student

## 2025-04-06 ENCOUNTER — Ambulatory Visit

## 2025-04-07 ENCOUNTER — Ambulatory Visit
